# Patient Record
Sex: Female | Born: 1945 | Race: White | Hispanic: No | Marital: Married | Smoking: Never smoker
Health system: Southern US, Community
[De-identification: ages and names within clinical notes are randomized; demographics above are authoritative.]

## PROBLEM LIST (undated history)

## (undated) DIAGNOSIS — R7303 Prediabetes: Secondary | ICD-10-CM

## (undated) DIAGNOSIS — M1990 Unspecified osteoarthritis, unspecified site: Secondary | ICD-10-CM

## (undated) DIAGNOSIS — E039 Hypothyroidism, unspecified: Secondary | ICD-10-CM

## (undated) DIAGNOSIS — F32A Depression, unspecified: Secondary | ICD-10-CM

## (undated) DIAGNOSIS — C801 Malignant (primary) neoplasm, unspecified: Secondary | ICD-10-CM

## (undated) DIAGNOSIS — I10 Essential (primary) hypertension: Secondary | ICD-10-CM

## (undated) DIAGNOSIS — D649 Anemia, unspecified: Secondary | ICD-10-CM

## (undated) DIAGNOSIS — I499 Cardiac arrhythmia, unspecified: Secondary | ICD-10-CM

## (undated) HISTORY — PX: GASTRIC RESTRICTION SURGERY: SHX653

## (undated) HISTORY — DX: Unspecified osteoarthritis, unspecified site: M19.90

## (undated) HISTORY — PX: CHOLECYSTECTOMY: SHX55

## (undated) HISTORY — PX: HERNIA REPAIR: SHX51

## (undated) HISTORY — PX: BREAST SURGERY: SHX581

## (undated) HISTORY — PX: JOINT REPLACEMENT: SHX530

## (undated) HISTORY — PX: TONSILLECTOMY: SUR1361

---

## 2001-04-10 ENCOUNTER — Ambulatory Visit: Admission: RE | Admit: 2001-04-10 | Discharge: 2001-04-10 | Payer: Self-pay | Admitting: Pulmonary Disease

## 2002-12-14 ENCOUNTER — Ambulatory Visit (HOSPITAL_BASED_OUTPATIENT_CLINIC_OR_DEPARTMENT_OTHER): Admission: RE | Admit: 2002-12-14 | Discharge: 2002-12-14 | Payer: Self-pay | Admitting: Orthopedic Surgery

## 2004-08-30 ENCOUNTER — Ambulatory Visit: Payer: Self-pay | Admitting: Family Medicine

## 2004-11-01 ENCOUNTER — Ambulatory Visit: Payer: Self-pay | Admitting: Family Medicine

## 2004-12-31 ENCOUNTER — Ambulatory Visit: Payer: Self-pay | Admitting: Family Medicine

## 2005-01-25 ENCOUNTER — Ambulatory Visit: Payer: Self-pay | Admitting: Family Medicine

## 2005-05-09 ENCOUNTER — Ambulatory Visit: Payer: Self-pay | Admitting: Family Medicine

## 2005-05-16 ENCOUNTER — Ambulatory Visit: Payer: Self-pay | Admitting: Family Medicine

## 2005-06-18 ENCOUNTER — Ambulatory Visit: Payer: Self-pay | Admitting: Internal Medicine

## 2005-07-17 ENCOUNTER — Ambulatory Visit: Payer: Self-pay | Admitting: Internal Medicine

## 2005-07-31 ENCOUNTER — Ambulatory Visit: Payer: Self-pay | Admitting: *Deleted

## 2005-08-02 ENCOUNTER — Ambulatory Visit: Payer: Self-pay | Admitting: Family Medicine

## 2005-08-06 ENCOUNTER — Ambulatory Visit: Payer: Self-pay | Admitting: Internal Medicine

## 2005-08-06 ENCOUNTER — Ambulatory Visit: Payer: Self-pay | Admitting: Cardiology

## 2005-08-15 ENCOUNTER — Ambulatory Visit: Payer: Self-pay | Admitting: Family Medicine

## 2005-08-16 DIAGNOSIS — Z853 Personal history of malignant neoplasm of breast: Secondary | ICD-10-CM | POA: Insufficient documentation

## 2005-08-30 ENCOUNTER — Ambulatory Visit: Payer: Self-pay | Admitting: Internal Medicine

## 2005-09-05 ENCOUNTER — Ambulatory Visit: Payer: Self-pay | Admitting: Oncology

## 2005-09-06 ENCOUNTER — Ambulatory Visit: Admission: RE | Admit: 2005-09-06 | Discharge: 2005-09-30 | Payer: Self-pay | Admitting: Radiation Oncology

## 2005-09-25 ENCOUNTER — Ambulatory Visit: Payer: Self-pay | Admitting: Family Medicine

## 2005-10-07 ENCOUNTER — Ambulatory Visit: Payer: Self-pay | Admitting: Family Medicine

## 2005-10-08 ENCOUNTER — Ambulatory Visit: Admission: RE | Admit: 2005-10-08 | Discharge: 2005-11-08 | Payer: Self-pay | Admitting: Radiation Oncology

## 2005-10-23 ENCOUNTER — Ambulatory Visit: Payer: Self-pay | Admitting: Oncology

## 2005-12-11 ENCOUNTER — Ambulatory Visit: Payer: Self-pay | Admitting: Oncology

## 2006-01-29 ENCOUNTER — Ambulatory Visit: Payer: Self-pay | Admitting: Oncology

## 2006-02-14 ENCOUNTER — Ambulatory Visit: Payer: Self-pay | Admitting: Oncology

## 2006-03-21 ENCOUNTER — Ambulatory Visit: Payer: Self-pay | Admitting: Oncology

## 2006-05-02 ENCOUNTER — Ambulatory Visit: Payer: Self-pay | Admitting: Oncology

## 2006-05-06 ENCOUNTER — Ambulatory Visit: Admission: RE | Admit: 2006-05-06 | Discharge: 2006-07-11 | Payer: Self-pay | Admitting: Radiation Oncology

## 2006-05-14 ENCOUNTER — Ambulatory Visit: Payer: Self-pay | Admitting: Oncology

## 2006-07-04 ENCOUNTER — Ambulatory Visit: Payer: Self-pay | Admitting: Oncology

## 2006-11-07 ENCOUNTER — Ambulatory Visit: Payer: Self-pay | Admitting: Oncology

## 2006-12-29 ENCOUNTER — Ambulatory Visit: Payer: Self-pay | Admitting: Oncology

## 2007-04-20 ENCOUNTER — Ambulatory Visit: Payer: Self-pay | Admitting: Oncology

## 2010-01-08 ENCOUNTER — Inpatient Hospital Stay (HOSPITAL_COMMUNITY): Admission: RE | Admit: 2010-01-08 | Discharge: 2010-01-12 | Payer: Self-pay | Admitting: Orthopedic Surgery

## 2010-11-12 LAB — CBC
HCT: 24.8 % — ABNORMAL LOW (ref 36.0–46.0)
HCT: 26.6 % — ABNORMAL LOW (ref 36.0–46.0)
HCT: 28.9 % — ABNORMAL LOW (ref 36.0–46.0)
Hemoglobin: 8.4 g/dL — ABNORMAL LOW (ref 12.0–15.0)
Hemoglobin: 8.8 g/dL — ABNORMAL LOW (ref 12.0–15.0)
MCV: 96.3 fL (ref 78.0–100.0)
MCV: 96.7 fL (ref 78.0–100.0)
Platelets: 190 10*3/uL (ref 150–400)
RBC: 2.74 MIL/uL — ABNORMAL LOW (ref 3.87–5.11)
RBC: 2.99 MIL/uL — ABNORMAL LOW (ref 3.87–5.11)
WBC: 5.5 10*3/uL (ref 4.0–10.5)
WBC: 7.8 10*3/uL (ref 4.0–10.5)

## 2010-11-12 LAB — GLUCOSE, CAPILLARY
Glucose-Capillary: 109 mg/dL — ABNORMAL HIGH (ref 70–99)
Glucose-Capillary: 221 mg/dL — ABNORMAL HIGH (ref 70–99)
Glucose-Capillary: 96 mg/dL (ref 70–99)

## 2010-11-12 LAB — BASIC METABOLIC PANEL
BUN: 11 mg/dL (ref 6–23)
CO2: 28 mEq/L (ref 19–32)
CO2: 29 mEq/L (ref 19–32)
Calcium: 8.1 mg/dL — ABNORMAL LOW (ref 8.4–10.5)
Calcium: 8.5 mg/dL (ref 8.4–10.5)
Chloride: 106 mEq/L (ref 96–112)
Creatinine, Ser: 0.48 mg/dL (ref 0.4–1.2)
GFR calc Af Amer: >60 mL/min (ref >60)
GFR calc non Af Amer: >60 mL/min (ref >60)
GFR calc non Af Amer: >60 mL/min (ref >60)
Glucose, Bld: 134 mg/dL — ABNORMAL HIGH (ref 70–99)
Potassium: 4 mEq/L (ref 3.5–5.1)
Potassium: 4.2 mEq/L (ref 3.5–5.1)
Sodium: 136 mEq/L (ref 135–145)
Sodium: 138 mEq/L (ref 135–145)

## 2010-11-12 LAB — PROTIME-INR
INR: 1.6 — ABNORMAL HIGH (ref 0.00–1.49)
INR: 1.61 — ABNORMAL HIGH (ref 0.00–1.49)
Prothrombin Time: 13.4 seconds (ref 11.6–15.2)
Prothrombin Time: 19 seconds — ABNORMAL HIGH (ref 11.6–15.2)

## 2010-11-13 LAB — COMPREHENSIVE METABOLIC PANEL
ALT: 35 U/L (ref 0–35)
AST: 40 U/L — ABNORMAL HIGH (ref 0–37)
CO2: 31 mEq/L (ref 19–32)
Calcium: 8.7 mg/dL (ref 8.4–10.5)
Chloride: 105 mEq/L (ref 96–112)
GFR calc Af Amer: >60 mL/min (ref >60)
GFR calc non Af Amer: >60 mL/min (ref >60)
Potassium: 3.3 mEq/L — ABNORMAL LOW (ref 3.5–5.1)
Sodium: 141 mEq/L (ref 135–145)

## 2010-11-13 LAB — URINALYSIS, ROUTINE W REFLEX MICROSCOPIC
Nitrite: NEGATIVE
Specific Gravity, Urine: 1.021 (ref 1.005–1.030)
Urobilinogen, UA: 1 mg/dL (ref 0.0–1.0)

## 2010-11-13 LAB — TYPE AND SCREEN: ABO/RH(D): A POS

## 2010-11-13 LAB — ABO/RH: ABO/RH(D): A POS

## 2010-11-13 LAB — CBC
MCHC: 33 g/dL (ref 30.0–36.0)
RBC: 3.67 MIL/uL — ABNORMAL LOW (ref 3.87–5.11)
WBC: 4.2 10*3/uL (ref 4.0–10.5)

## 2012-04-29 ENCOUNTER — Other Ambulatory Visit: Payer: Self-pay

## 2012-04-29 DIAGNOSIS — M79609 Pain in unspecified limb: Secondary | ICD-10-CM

## 2012-04-29 DIAGNOSIS — R609 Edema, unspecified: Secondary | ICD-10-CM

## 2012-05-08 ENCOUNTER — Encounter: Payer: Self-pay | Admitting: Vascular Surgery

## 2012-05-11 ENCOUNTER — Encounter: Payer: Self-pay | Admitting: Vascular Surgery

## 2012-05-11 ENCOUNTER — Ambulatory Visit (INDEPENDENT_AMBULATORY_CARE_PROVIDER_SITE_OTHER): Payer: Medicare Other | Admitting: Vascular Surgery

## 2012-05-11 ENCOUNTER — Encounter (INDEPENDENT_AMBULATORY_CARE_PROVIDER_SITE_OTHER): Payer: Medicare Other | Admitting: *Deleted

## 2012-05-11 VITALS — BP 149/81 | HR 62 | Resp 18 | Ht 63.0 in | Wt 165.0 lb

## 2012-05-11 DIAGNOSIS — R609 Edema, unspecified: Secondary | ICD-10-CM

## 2012-05-11 DIAGNOSIS — R600 Localized edema: Secondary | ICD-10-CM

## 2012-05-11 DIAGNOSIS — M79606 Pain in leg, unspecified: Secondary | ICD-10-CM | POA: Insufficient documentation

## 2012-05-11 DIAGNOSIS — M79609 Pain in unspecified limb: Secondary | ICD-10-CM

## 2012-05-11 DIAGNOSIS — I83893 Varicose veins of bilateral lower extremities with other complications: Secondary | ICD-10-CM

## 2012-05-11 NOTE — Progress Notes (Signed)
Subjective:     Patient ID: Deborah Frazier, female   DOB: 21-Apr-1946, 66 y.o.   MRN: [id number]  HPI this 66 year old female was referred by Dr. Dina Rich for evaluation of bilateral lower extremity edema. Patient has a remote history of vein stripping in the left leg 40 years ago. She also has had "phlebitis" in both legs in the past. She has no definite history of DVT. She denies a history of stasis ulcers, bleeding, or severe pain but does have aching discomfort as the day progresses. Recently her swelling in both legs has worsened left worse than right. She is not wear elastic compression stockings because of her arthritis and difficulty putting the stockings on. She does not elevate her legs or regular basis.  Past Medical History  Diagnosis Date  . Arthritis     History  Substance Use Topics  . Smoking status: Never Smoker   . Smokeless tobacco: Never Used  . Alcohol Use: No    Family History  Problem Relation Age of Onset  . Arthritis Mother   . Hypertension Mother   . Cancer Father     CANCER    Allergies  Allergen Reactions  . Remicade (Infliximab)     Current outpatient prescriptions:aspirin 81 MG tablet, Take 81 mg by mouth daily., Disp: , Rfl: ;  docusate sodium (COLACE) 100 MG capsule, Take 100 mg by mouth as needed., Disp: , Rfl: ;  fish oil-omega-3 fatty acids 1000 MG capsule, Take 2 g by mouth daily., Disp: , Rfl: ;  glucosamine-chondroitin 500-400 MG tablet, Take 1 tablet by mouth daily., Disp: , Rfl: ;  leflunomide (ARAVA) 20 MG tablet, Take 20 mg by mouth daily., Disp: , Rfl:  levothyroxine (SYNTHROID, LEVOTHROID) 25 MCG tablet, Take 25 mcg by mouth daily., Disp: , Rfl: ;  Multiple Vitamin (STRESS/BIOTIN PO), Take 10,000 mcg by mouth daily., Disp: , Rfl: ;  Multiple Vitamins-Minerals (CENTRUM SILVER PO), Take by mouth daily., Disp: , Rfl: ;  PARoxetine (PAXIL) 30 MG tablet, Take 30 mg by mouth every morning., Disp: , Rfl:   BP 149/81  Pulse 62  Resp 18  Ht  5\' 3"  (1.6 m)  Wt 165 lb (74.844 kg)  BMI 29.23 kg/m2  Body mass index is 29.23 kg/(m^2).        Review of Systems denies chest pain, dyspnea on exertion, PND, orthopnea, hemoptysis. Does complain of leg discomfort with walking, swelling in ankles, weakness in arms and legs, history of right knee replacement. Other systems negative and complete review of systems with the exception of previous history of breast cancer    Objective:   Physical Exam blood pressure 149/81 heart rate 62 respirations 18   Gen.-alert and oriented x3 in no apparent distress HEENT normal for age Lungs no rhonchi or wheezing Cardiovascular regular rhythm no murmurs carotid pulses 3+ palpable no bruits audible Abdomen soft nontender no palpable masses Musculoskeletal previous right knee replacement. Typical deformities for rheumatoid arthritis in upper and lower extremities noted. Skin clear -no rashes Neurologic normal Lower extremities 3+ femoral and dorsalis pedis pulses palpable bilaterally with 1+ bilateral edema. Diffuse spider veins bilaterally in the lower thighs and proximal medial calf. No hyperpigmentation or active ulceration is noted.  Today I ordered bilateral venous duplex exam which I reviewed and interpreted. The right leg reveals reflux in the deep venous system. It appears that she has had previous ligation of her great saphenous vein and the caliber is small. Left leg has previous great  saphenous stripping and is currently absent. There is reflux in the deep system on the left also.  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

## 2014-04-27 ENCOUNTER — Ambulatory Visit (INDEPENDENT_AMBULATORY_CARE_PROVIDER_SITE_OTHER): Payer: Medicare Other

## 2014-04-27 DIAGNOSIS — R52 Pain, unspecified: Secondary | ICD-10-CM

## 2014-04-27 DIAGNOSIS — M216X9 Other acquired deformities of unspecified foot: Secondary | ICD-10-CM

## 2014-04-27 DIAGNOSIS — Q828 Other specified congenital malformations of skin: Secondary | ICD-10-CM

## 2014-04-27 DIAGNOSIS — M21079 Valgus deformity, not elsewhere classified, unspecified ankle: Secondary | ICD-10-CM

## 2014-04-27 DIAGNOSIS — M2040 Other hammer toe(s) (acquired), unspecified foot: Secondary | ICD-10-CM

## 2014-04-27 DIAGNOSIS — M069 Rheumatoid arthritis, unspecified: Secondary | ICD-10-CM

## 2014-04-27 NOTE — Progress Notes (Signed)
   Subjective:    Patient ID: Deborah Frazier, female    DOB: [birth year], 68 y.o.   MRN: [id number]  HPI I AM HERE TO GET SOME NEW INSERTS AND MY 5TH TOE ON MY LEFT FOOT IS SORE AND TENDER AND IS RED AND IT HAS A CORN ON IT AND LAYS OVER AND THROBS AND TINGLING AND HAS BEEN GOING ON FOR ABOUT 2 TO 3 WEEKS AND I DO AN IV SIMPANI ARIA EVERY 8 WEEKS    Review of Systems  Constitutional: Positive for fatigue.  Cardiovascular: Positive for leg swelling.  Musculoskeletal: Positive for gait problem.       JOINT PAIN  All other systems reviewed and are negative.      Objective:   Physical Exam 68 year old white female well-developed well-nourished oriented x3 presents at this time with a complaint of a painful fifth toe left foot as well as a new orthotics. 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  Arch and objective findings reveal vascular status to be intact DP pulse two over four bilateral PT one over 4 bilateral capillary refill time 3 seconds all digits. Epicritic sensations intact and symmetric bilateral is normal plantar response DTRs not listed. Dermatologically skin color pigment normal hair growth absent distally nails somewhat criptotic otherwise unremarkable there is keratoses medial fifth digit left foot to rigid digital contracture and bony prominence. Than orthopedic biomechanical exam severe rheumatoid changes with lateral deviation of the lesser digits on the left on all digits on the left foot lateral deviation is fourth and fifth on the right with medial displacements second third overlapping the hallux on the right to patient is been wearing orthoses for the past 6 years with good success from there should be worn and broken down and need replacing at this time no signs of infection no open wounds no ulcers noted at this time.       Assessment & Plan:  Assessment this  time is hammertoe deformity with possible fracture based on x-ray findings of the fifth digit proximal phalanx head left foot this time keratotic lesion is debrided treated with lumicain Silvadene and a Band-Aid dressing. Patient is dispensed and tubercle padding to keep the toes separated may be candidate for some hammertoe surgery or ostectomy at some point in the future if systems or if symptoms persist or continue. At this time patient is also candidate for new diabetic or her accommodative insoles with a Plastizote top cover somewhat for diabetic orthotic based on her deformity and skin atrophy and rheumatoid changes would benefit to prevent ulceration complications. Scheduled for casting at her convenience plan recommend orthotic a Plastizote top cover [place]x type base with some dorsally and shell. Patient be followed up with the next month with orthotics ready for fitting and dispensing  Harriet Masson DPM

## 2014-04-27 NOTE — Patient Instructions (Signed)
Corns and Calluses Corns are small areas of thickened skin that usually occur on the top, sides, or tip of a toe. They contain a cone-shaped core with a point that can press on a nerve below. This causes pain. Calluses are areas of thickened skin that usually develop on hands, fingers, palms, soles of the feet, and heels. These are areas that experience frequent friction or pressure. CAUSES  Corns are usually the result of rubbing (friction) or pressure from shoes that are too tight or do not fit properly. Calluses are caused by repeated friction and pressure on the affected areas. SYMPTOMS  A hard growth on the skin.  Pain or tenderness under the skin.  Sometimes, redness and swelling.  Increased discomfort while wearing tight-fitting shoes. DIAGNOSIS  Your caregiver can usually tell what the problem is by doing a physical exam. TREATMENT  Removing the cause of the friction or pressure is usually the only treatment needed. However, sometimes medicines can be used to help soften the hardened, thickened areas. These medicines include salicylic acid plasters and 12% ammonium lactate lotion. These medicines should only be used under the direction of your caregiver. HOME CARE INSTRUCTIONS   Try to remove pressure from the affected area.  You may wear donut-shaped corn pads to protect your skin.  You may use a pumice stone or nonmetallic nail file to gently reduce the thickness of a corn.  Wear properly fitted footwear.  If you have calluses on the hands, wear gloves during activities that cause friction.  If you have diabetes, you should regularly examine your feet. Tell your caregiver if you notice any problems with your feet. SEEK IMMEDIATE MEDICAL CARE IF:   You have increased pain, swelling, redness, or warmth in the affected area.  Your corn or callus starts to drain fluid or bleeds.  You are not getting better, even with treatment. Document Released: 05/18/2004 Document  Revised: 11/04/2011 Document Reviewed: 04/09/2011 [employer] Patient Information 2015 [employer]. This information is not intended to replace advice given to you by your health care provider. Make sure you discuss any questions you have with your health care provider.  

## 2014-06-24 ENCOUNTER — Ambulatory Visit (INDEPENDENT_AMBULATORY_CARE_PROVIDER_SITE_OTHER): Payer: Medicare Other

## 2014-06-24 DIAGNOSIS — M21079 Valgus deformity, not elsewhere classified, unspecified ankle: Secondary | ICD-10-CM

## 2014-06-24 DIAGNOSIS — M2040 Other hammer toe(s) (acquired), unspecified foot: Secondary | ICD-10-CM

## 2014-06-24 DIAGNOSIS — M216X9 Other acquired deformities of unspecified foot: Secondary | ICD-10-CM

## 2014-06-24 DIAGNOSIS — Q828 Other specified congenital malformations of skin: Secondary | ICD-10-CM

## 2014-06-24 DIAGNOSIS — M069 Rheumatoid arthritis, unspecified: Secondary | ICD-10-CM

## 2014-06-24 NOTE — Patient Instructions (Signed)

## 2014-06-24 NOTE — Progress Notes (Signed)
   Subjective:    Patient ID: Deborah Frazier, female    DOB: [birth year], 68 y.o.   MRN: [id number]  HPI  PUO AND GIVEN INSTRUCTION.  PT STATED LT FOOT ARCH HAVE HARD PLACE AND IS BEEN SORE FOR 2 WEEKS. THE FOOT IS MUCH BETTER AND GET AGGRAVATED BY PRESSURE. TRIED TO ANTIBIOTIC CREAM BUT NO HELP.  Review of Systems  All other systems reviewed and are negative.  no new findings or systemic changes noted     Objective:   Physical Exam Neurovascular status is intact pedal pulses are palpable patient does have severe deformity of the foot with prominence of the digits as well as significant plantar prominence of the first metatarsocuneiform base plantarly due to rheumatoid arthropathy and prone 3 changes of the foot patient developed a hemorrhagic keratoses or small blister but 5 mm in diameter under this area does not have any discharge or drainage it was erythematous she treated with topical gel and buttock ointment and a bandage since resolved her condition: Down no discharge drainage no signs of infection at the current time. Remainder remainder of exam unremarkable neurovascular status intact rigid digital contractures history of keratoses .at this time dispense one pair of custom orthotics with Plastizote top cover the fit and contour well to the foot patient will initiate break in period using the orthotics appropriately her old orthotics may be dropped off at some point for refurbishing with use Plastizote top cover the cost of $35 when ready. Maintain orthotic use at all time also continue maintaining appropriate accommodative shoes       Assessment & Plan:  Assessment this time rheumatoid arthropathy with capsulitis history of multiple keratoses and gait abnormality. At this time patient is dispensed new functional orthoses with possible top cover to accommodate deformities assist with gait alleviate pressure and prevent ulceration. The appointment future as needed    Harriet Masson  DPM

## 2015-01-16 ENCOUNTER — Ambulatory Visit (INDEPENDENT_AMBULATORY_CARE_PROVIDER_SITE_OTHER): Payer: Medicare Other | Admitting: Podiatry

## 2015-01-16 ENCOUNTER — Encounter: Payer: Self-pay | Admitting: Podiatry

## 2015-01-16 ENCOUNTER — Ambulatory Visit (INDEPENDENT_AMBULATORY_CARE_PROVIDER_SITE_OTHER): Payer: Medicare Other

## 2015-01-16 VITALS — BP 132/70 | HR 66 | Resp 12

## 2015-01-16 DIAGNOSIS — L03032 Cellulitis of left toe: Secondary | ICD-10-CM | POA: Diagnosis not present

## 2015-01-16 DIAGNOSIS — L02612 Cutaneous abscess of left foot: Secondary | ICD-10-CM | POA: Diagnosis not present

## 2015-01-16 DIAGNOSIS — R52 Pain, unspecified: Secondary | ICD-10-CM | POA: Diagnosis not present

## 2015-01-16 MED ORDER — SULFAMETHOXAZOLE-TRIMETHOPRIM 800-160 MG PO TABS: 1.0000 | ORAL_TABLET | Freq: Two times a day (BID) | ORAL | Status: DC

## 2015-01-16 NOTE — Progress Notes (Signed)
   Subjective:    Patient ID: Deborah Frazier, female    DOB: 1945/09/09, 69 y.o.   MRN: [id number]  HPI ''LT FOOT 5TH TOE IS SWOLLEN AND PAINFUL.'' She describes approximately 3 week history of this problem without any specific treatment. Patient describes previous difficult infections to treat and was told that the antibiotic that ultimately resolved infection was an antibiotic, useD for MRSA infections, however, patient does not recall the name of the antibiotic. Currently patient also having local wound care for skin ulcer on her left lower leg by primary care physician applying a silver coated dressing to the area.   Patient is a known rheumatoid arthritic  Review of Systems  Cardiovascular: Positive for leg swelling.  Skin: Positive for color change.       Objective:   Physical Exam  Patient appears orientated 3  Vascular: DP pulse right 2/4 DP pulse left 1/4 PT pulses 2/4 bilaterally  Neurological: Ankle reflexes equal and reactive bilaterally  Dermatological: Gauze dressing over skin ulcer left medial left leg under treatment by another physician Surgical scar over left first MPJ The fifth left toe is erythematous and edematous with macerated skin medial aspect of the fifth digit left without any active drainage, odor or warmth  Musculoskeletal: Lateral drifting of digits 2 through 5, bilaterally Overlapping second right toe   X-ray examination left foot  Decreased bone densities noted throughout all views Decrease joint spaces in rear foot in all views Windswept lateral drifting of toes 2-5 Surgical resection base of proximal phalanx hallux and medial condyle first MPJ Digits 2-5 have extreme contracture at PIPJ Arthritic deformities noted digits 2-5  No emphysema noted fifth digit   Radiographic impression: Arthritic changes noted in rear foot and digits 2-5 Narrowing of all joint space suggested of arthritic changes Deformity of the proximal  interphalangeal joint fifth digit most consistent with arthritic change     Assessment & Plan:   Assessment: Cellulitis fifth toe left foot  Plan: I  discusIed the results of the examination with patient today and advised her that she had a infection in the fifth left toe and I would prescribe oral antibiotics  Rx Bactrim DS by mouth twice a day 10 days, one refill  Patient advised to return for further evaluation if the swelling in pain and redness do not reduce after 10 days of oral antibiotics

## 2015-01-16 NOTE — Patient Instructions (Addendum)
Wear soft shoes Begin taking Bactrim 1 tablet twice a day 10 days Drink a large glass of water when using this medication  Return for further evaluation if the redness and swelling do not reduce with the oral antibiotics 10 days

## 2015-02-09 ENCOUNTER — Ambulatory Visit (INDEPENDENT_AMBULATORY_CARE_PROVIDER_SITE_OTHER): Payer: Medicare Other | Admitting: Podiatry

## 2015-02-09 DIAGNOSIS — A4902 Methicillin resistant Staphylococcus aureus infection, unspecified site: Secondary | ICD-10-CM | POA: Diagnosis not present

## 2015-02-09 DIAGNOSIS — M069 Rheumatoid arthritis, unspecified: Secondary | ICD-10-CM | POA: Diagnosis not present

## 2015-02-09 DIAGNOSIS — M129 Arthropathy, unspecified: Secondary | ICD-10-CM

## 2015-02-09 DIAGNOSIS — M19079 Primary osteoarthritis, unspecified ankle and foot: Secondary | ICD-10-CM

## 2015-02-09 MED ORDER — MELOXICAM 15 MG PO TABS: 15.0000 mg | ORAL_TABLET | Freq: Every day | ORAL | Status: DC

## 2015-02-09 MED ORDER — SULFAMETHOXAZOLE-TRIMETHOPRIM 800-160 MG PO TABS: 1.0000 | ORAL_TABLET | Freq: Two times a day (BID) | ORAL | Status: DC

## 2015-02-09 NOTE — Progress Notes (Signed)
Subjective:     Patient ID: Deborah Frazier, female   DOB: [birth year], 69 y.o.   MRN: [id number]  HPIThis patient returns to the [place] office with red and swollen and painful fifth toe left foot.  She says the toe had just got better on antibiotics and has now gotten bad again.  She has pain walking and wearing her shoes.  She is frustrated concerning the return of her red toe.  She was treated by Dr. Amalia Hailey with bactrim  But he also found masceration in 4th interspace left foot.     Review of Systems     Objective:   Physical Exam Objective: Review of past medical history, medications, social history and allergies were performed.  Vascular: Dorsalis pedis and posterior tibial pulses were palpable B/L, capillary refill was  WNL B/L, temperature gradient was WNL B/L   Skin:  No signs of symptoms of infection or ulcers on both feet  Nails: appear healthy with no signs of mycosis or infections  Sensory: Semmes Weinstein monifilament WNL   Orthopedic: Orthopedic evaluation demonstrates all joints distal t ankle have full ROM without crepitus, muscle power WNL B/L.  There is red swollen painful fifth toe at palpation of base proximal phalanx fifth toe left foot.  There seems to be mild reaction and even the swelling is not severe.  She has mild redness and swelling extending to top of left foot as far as the fifth metatarsal.     Assessment:     Cellulitis fifth toe left foot  2. RA changes radiographically     Plan:     ROV.  Prescribed Bactrim # 20.  Prescribed Mobic to treat an arthritis flare-up.  I could not determine which was her true cause so I treated both conditions. Radiographic studies reveal base fracture proximal phalanx.

## 2015-03-03 ENCOUNTER — Encounter: Payer: Self-pay | Admitting: Podiatry

## 2015-03-03 ENCOUNTER — Ambulatory Visit (INDEPENDENT_AMBULATORY_CARE_PROVIDER_SITE_OTHER): Payer: Medicare Other | Admitting: Podiatry

## 2015-03-03 ENCOUNTER — Ambulatory Visit (INDEPENDENT_AMBULATORY_CARE_PROVIDER_SITE_OTHER): Payer: Medicare Other

## 2015-03-03 ENCOUNTER — Telehealth: Payer: Self-pay | Admitting: *Deleted

## 2015-03-03 VITALS — BP 139/77 | HR 60 | Temp 97.7°F | Resp 12

## 2015-03-03 DIAGNOSIS — L02612 Cutaneous abscess of left foot: Secondary | ICD-10-CM | POA: Diagnosis not present

## 2015-03-03 DIAGNOSIS — M2041 Other hammer toe(s) (acquired), right foot: Secondary | ICD-10-CM | POA: Diagnosis not present

## 2015-03-03 DIAGNOSIS — M069 Rheumatoid arthritis, unspecified: Secondary | ICD-10-CM

## 2015-03-03 DIAGNOSIS — L03032 Cellulitis of left toe: Secondary | ICD-10-CM

## 2015-03-03 DIAGNOSIS — M2040 Other hammer toe(s) (acquired), unspecified foot: Secondary | ICD-10-CM | POA: Diagnosis not present

## 2015-03-03 MED ORDER — SULFAMETHOXAZOLE-TRIMETHOPRIM 800-160 MG PO TABS: 1.0000 | ORAL_TABLET | Freq: Two times a day (BID) | ORAL | Status: DC

## 2015-03-03 NOTE — Telephone Encounter (Signed)
Dr. Paulla Dolly ordered MRI of left 5th toe without contrast suspect osteomyelitis.

## 2015-03-04 NOTE — Progress Notes (Signed)
Subjective:     Patient ID: Deborah Frazier, female   DOB: 28-Jul-1946, 69 y.o.   MRN: [id number]  HPI patient presents stating my left fifth toe has been swollen since September and I have had several different biotics which seem to give me short-term relief of my symptoms but then the redness seems to return and the swelling. I've never noticed any drainage and I do have rheumatoid arthritis   Review of Systems     Objective:   Physical Exam Neurovascular status found to be intact with muscle strength diminished and range of motion diminished subtalar midtarsal joint with numerous indications of rheumatoid type condition within the metatarsal phalangeal joints. Patient's left fifth toe is inflamed but there is no breakage of skin and it does extend the metatarsophalangeal joint with no proximal spread noted currently.    Assessment:     Difficult to ascertain as to whether or not we may be dealing with a osteomyelitis type condition or an inflammatory condition secondary to rheumatoid arthritis    Plan:     I'm going to send her for an MRI due to this condition and it's nine-month history. I did place her on Bactrim as a precautionary measure and she will continue to take that and then today [place] there is a very good chance she is can require amputation of this fifth toe or other more aggressive treatment plan. Hopefully the MRI will help [place] differentiate between an inflammatory and infective process

## 2015-03-09 ENCOUNTER — Telehealth: Payer: Self-pay | Admitting: *Deleted

## 2015-03-09 NOTE — Telephone Encounter (Addendum)
Dr. Paulla Dolly states MRI 03/08/2015 does show osteomyelitis of the left 5th toe, and would like to refer to Dr. Jacqualyn Posey for consultation for amputation of left 5th toe.  I informed pt, that Dr. Paulla Dolly had reviewed the MRI and would like to refer her to Dr. Jacqualyn Posey.  Pt agreed and was transferred to schedulers.

## 2015-03-09 NOTE — Telephone Encounter (Signed)
You don't have to tell them the result. Have them come in and see me. Thanks.

## 2015-03-15 ENCOUNTER — Ambulatory Visit (INDEPENDENT_AMBULATORY_CARE_PROVIDER_SITE_OTHER): Payer: Medicare Other | Admitting: Podiatry

## 2015-03-15 ENCOUNTER — Telehealth: Payer: Self-pay | Admitting: *Deleted

## 2015-03-15 ENCOUNTER — Encounter: Payer: Self-pay | Admitting: Podiatry

## 2015-03-15 VITALS — BP 135/73 | HR 76 | Resp 16

## 2015-03-15 DIAGNOSIS — M86672 Other chronic osteomyelitis, left ankle and foot: Secondary | ICD-10-CM

## 2015-03-15 DIAGNOSIS — L97521 Non-pressure chronic ulcer of other part of left foot limited to breakdown of skin: Secondary | ICD-10-CM | POA: Diagnosis not present

## 2015-03-15 DIAGNOSIS — M2042 Other hammer toe(s) (acquired), left foot: Secondary | ICD-10-CM | POA: Diagnosis not present

## 2015-03-15 NOTE — Telephone Encounter (Signed)
Dr. Jacqualyn Posey referred pt to [facility] for treatment of left 5th toe osteomyelitis, pt refuses surgery.

## 2015-03-16 ENCOUNTER — Encounter: Payer: Self-pay | Admitting: Podiatry

## 2015-03-17 NOTE — Progress Notes (Signed)
Patient ID: Deborah Frazier, female   DOB: [birth year], 69 y.o.   MRN: [id number]  Subjective: 69 year old female presents the office today for follow-up evaluation after MRI. She states that she's had swelling to her left fifth toe for several months. She did previously have an ulceration to the area apparently however she says is not deep and she denies ever having any possibly from a. She states that her pain is improved. She denies any red streaks. Denies any recent injury or trauma. She has just finished her course of antibiotic since she has been on numerous antibiotic soap last several months. No other complaints at this time. Denies any systemic complaints as fevers, chills, nausea, vomiting. Denies any calf pain, chest pain, shortness of breath.  Objective: AAO 3, NAD DP/PT pulses palpable, CRT less than 3 seconds Protective sensation intact with Derrel Nip Monofilament.  Left fifth digit is slightly edematous and erythematous although is not warmth compared to the other digits and there is no ascending cellulitis. There is no areas of fluctuance or crepitus. There is no open wounds at this time. There is no increase in warmth. There is no tennis palpation upon the digit. No other areas of edema, erythema, increase in warmth to bilateral lower extremities. No open lesions or pre-ulcerative lesions bilaterally. No pain with calf compression, swelling, warmth, erythema.  Assessment: 69 year old female chronic osteomyelitis left fifth toe  Plan: -Previous x-rays and MRI results were discussed with the patient. Is it reveal chronic osteomyelitis the fifth digit. I discussed. Treatment options with the patient including antibiotic, amputation. She does not want any amputation at this time. She's been on numerous orthotics apparently over the last several months. There is continue swelling and redness of the toe. We'll refer to infectious disease to see if further antibodies were necessary  or patient just watch and observe the toe. Discussed that she is a high likelihood of amputation. -Follow-up after infectious disease consultation or sooner if any problems are to arise. Call with questions or concerns in the meantime.  Celesta Gentile, DPM

## 2015-03-23 ENCOUNTER — Ambulatory Visit (INDEPENDENT_AMBULATORY_CARE_PROVIDER_SITE_OTHER): Payer: Medicare Other | Admitting: Internal Medicine

## 2015-03-23 ENCOUNTER — Encounter: Payer: Self-pay | Admitting: Internal Medicine

## 2015-03-23 VITALS — BP 108/67 | HR 75 | Temp 98.7°F | Wt 168.0 lb

## 2015-03-23 DIAGNOSIS — M869 Osteomyelitis, unspecified: Secondary | ICD-10-CM | POA: Diagnosis present

## 2015-03-23 DIAGNOSIS — Z9081 Acquired absence of spleen: Secondary | ICD-10-CM

## 2015-03-23 DIAGNOSIS — Z9884 Bariatric surgery status: Secondary | ICD-10-CM

## 2015-03-23 DIAGNOSIS — M069 Rheumatoid arthritis, unspecified: Secondary | ICD-10-CM | POA: Diagnosis not present

## 2015-03-24 ENCOUNTER — Telehealth: Payer: Self-pay | Admitting: *Deleted

## 2015-03-24 ENCOUNTER — Ambulatory Visit (HOSPITAL_COMMUNITY)
Admission: RE | Admit: 2015-03-24 | Discharge: 2015-03-24 | Disposition: A | Discharge status: Home / Self Care | Payer: Medicare Other | Source: Ambulatory Visit | Attending: Internal Medicine | Admitting: Internal Medicine

## 2015-03-24 ENCOUNTER — Encounter (HOSPITAL_COMMUNITY)
Admission: RE | Admit: 2015-03-24 | Discharge: 2015-03-24 | Disposition: A | Discharge status: Home / Self Care | Payer: Medicare Other | Source: Ambulatory Visit | Attending: Internal Medicine | Admitting: Internal Medicine

## 2015-03-24 ENCOUNTER — Other Ambulatory Visit: Payer: Self-pay | Admitting: Internal Medicine

## 2015-03-24 DIAGNOSIS — M868X7 Other osteomyelitis, ankle and foot: Secondary | ICD-10-CM | POA: Diagnosis not present

## 2015-03-24 DIAGNOSIS — M869 Osteomyelitis, unspecified: Secondary | ICD-10-CM | POA: Diagnosis not present

## 2015-03-24 DIAGNOSIS — Z9081 Acquired absence of spleen: Secondary | ICD-10-CM | POA: Insufficient documentation

## 2015-03-24 DIAGNOSIS — Z9884 Bariatric surgery status: Secondary | ICD-10-CM | POA: Insufficient documentation

## 2015-03-24 DIAGNOSIS — M069 Rheumatoid arthritis, unspecified: Secondary | ICD-10-CM | POA: Insufficient documentation

## 2015-03-24 MED ORDER — CEFTRIAXONE SODIUM IN DEXTROSE 40 MG/ML IV SOLN
2.0000 g | Freq: Once | INTRAVENOUS | Status: AC
  Administered 2015-03-24: 2 g via INTRAVENOUS
  Filled 2015-03-24: qty 50

## 2015-03-24 MED ORDER — HEPARIN SOD (PORK) LOCK FLUSH 100 UNIT/ML IV SOLN
INTRAVENOUS | Status: AC
  Filled 2015-03-24: qty 5

## 2015-03-24 MED ORDER — HEPARIN SOD (PORK) LOCK FLUSH 100 UNIT/ML IV SOLN
INTRAVENOUS | Status: AC
  Administered 2015-03-24: 250 [IU]
  Filled 2015-03-24: qty 5

## 2015-03-24 MED ORDER — VANCOMYCIN HCL IN DEXTROSE 1-5 GM/200ML-% IV SOLN
1000.0000 mg | Freq: Once | INTRAVENOUS | Status: AC
  Administered 2015-03-24: 1000 mg via INTRAVENOUS

## 2015-03-24 MED ORDER — VANCOMYCIN HCL IN DEXTROSE 1-5 GM/200ML-% IV SOLN
INTRAVENOUS | Status: AC
  Administered 2015-03-24: 1000 mg via INTRAVENOUS
  Filled 2015-03-24: qty 200

## 2015-03-24 MED ORDER — LIDOCAINE HCL 1 % IJ SOLN
INTRAMUSCULAR | Status: AC
  Filled 2015-03-24: qty 20

## 2015-03-24 NOTE — Progress Notes (Signed)
RFV: osteomyelitis of left foot Subjective:    Patient ID: Deborah Frazier, female    DOB: 1946-01-03, 69 y.o.   MRN: [id number]  HPI  69yo F with rheumatoid arthritis who has been treated for the last 9 months for "cellulitis" of 5th toe of left foot. She did have trauma to her toe late last year that may have been the precursor. She has been intermittently placed on oral antibiotics that have improved her symptoms up until recently where it still persists to be swollen and erythematous. He had expressed purulent material from her toe last week. She is being followed by dr. Paulino [facility] at [facility] who provided multiple courses of bactrim and local wound care dressing with silver coated dressing to the area. He had mri done which showed evidence of osteomyelitis. He recommended amputation but patient was hesistant at that approach. She was referred to [facility] for other treatment options. I have reviewed her doctors records. And i have also independently reviewed xrays of affected foot. 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  She gets her infusion for RA every 8 wks. Her last infusion was roughly 4 wks ago. Her rheumatologist has deferred infusions if she is getting actively treated for infection  Allergies  Allergen Reactions  . Remicade [Infliximab]    Current Outpatient Prescriptions on File Prior to Visit  Medication Sig Dispense Refill  . aspirin 81 MG tablet Take 81 mg by mouth daily.    Marland Kitchen levothyroxine (SYNTHROID, LEVOTHROID) 25 MCG tablet Take 25 mcg by mouth daily.    . Multiple Vitamins-Minerals (CENTRUM SILVER PO) Take by mouth daily.    Marland Kitchen PARoxetine (PAXIL) 30 MG tablet Take 30 mg by mouth every morning.     No current facility-administered medications on file prior to visit.   Active Ambulatory Problems    Diagnosis Date Noted  . Leg pain 05/11/2012  . Edema leg  05/11/2012   Resolved Ambulatory Problems    Diagnosis Date Noted  . No Resolved Ambulatory Problems   Past Medical History  Diagnosis Date  . Arthritis    History  Substance Use Topics  . Smoking status: Never Smoker   . Smokeless tobacco: Never Used  . Alcohol Use: No  family history includes Arthritis in her mother; Cancer in her father; Hypertension in her mother.  Review of Systems  Constitutional: Negative for fever, chills, diaphoresis, activity change, appetite change, fatigue and unexpected weight change.  HENT: Negative for congestion, sore throat, rhinorrhea, sneezing, trouble swallowing and sinus pressure.  Eyes: Negative for photophobia and visual disturbance.  Respiratory: Negative for cough, chest tightness, shortness of breath, wheezing and stridor.  Cardiovascular: Negative for chest pain, palpitations and leg swelling.  Gastrointestinal: Negative for nausea, vomiting, abdominal pain, diarrhea, constipation, blood in stool, abdominal distention and anal bleeding.  Genitourinary: Negative for dysuria, hematuria, flank pain and difficulty urinating.  Musculoskeletal: Negative for myalgias, back pain, joint swelling, arthralgias and gait problem.  Skin: erythema and swelling to left toe Neurological: Negative for dizziness, tremors, weakness and light-headedness.  Hematological: Negative for adenopathy. Does not bruise/bleed easily.  Psychiatric/Behavioral: Negative for behavioral problems, confusion, sleep disturbance, dysphoric mood, decreased concentration and agitation.        Objective:   Physical Exam BP 108/67 mmHg  Pulse 75  Temp(Src) 98.7 F (37.1 C) (Oral)  Wt 168 lb (76.204 kg)  Constitutional:  oriented  to person, place, and time. appears well-developed and well-nourished. No distress.  HENT: [place]/AT, PERRLA, no scleral icterus Mouth/Throat: Oropharynx is clear and moist. No oropharyngeal exudate.  Cardiovascular: Normal rate, regular rhythm and  normal heart sounds. Exam reveals no gallop and no friction rub.  No murmur heard.  Pulmonary/Chest: Effort normal and breath sounds normal. No respiratory distress.  has no wheezes.  Neck = supple, no nuchal rigidity Abdominal: Soft. Bowel sounds are normal.  exhibits no distension. There is no tenderness.  Lymphadenopathy: no cervical adenopathy. No axillary adenopathy eXt: marked deformity to hands and feet from RA Skin: left foot 5th toe swollen erythematous but no drainage or ulcer Psychiatric: a normal mood and affect.  behavior is normal.       Assessment & Plan:  Osteomyelitis of 5th toe = will get cbc with diff, sed rate, crp, bmp. Have disccused options that amputation is likely the fatest modality to treat her bone infection. She would like to avoid surgery, thus we will plan for 6 wk of iv vancomycin plus ceftriaxone. Will need to arrange with radiology for line placement and infusion center for first dose of medication. Will also need to arrange with home healht weekly lab draws to ensure she is therapueutic on medications. Discussed sided effects of antibiotics and precautions. Also discussed that IV therapy may not be sufficient and may eventually go onto amputation  toxity associated with medication = will have home health monitor per protocol vanco trough and send results to clinic as fyi.  Rheumatoid arthritis = anticipate slight delay with next infusion since we are planning on treating with 4-6 wk of IV therapy.  Spent 45 min with greater than 50% in direct counseling and coordination of care regarding foot osteomyelitis.

## 2015-03-24 NOTE — Telephone Encounter (Signed)
Called the patient to advise her that because she did not get labs yesterday she will need to arrive at the hospital early for a STAT (CMP) but was unable to reach her. Tried to get her last labs from [facility] and they would not send them without speaking with the patient.

## 2015-03-24 NOTE — Telephone Encounter (Signed)
Pt did not have labs drawn at [facility].  Verbal order from Dr. Baxter Flattery to have [facility] RN draw CBC w Diff, BMP, Sed Rate and CRP at the start of care visit.  Order given to [place], [place] and read back.

## 2015-04-27 ENCOUNTER — Ambulatory Visit (INDEPENDENT_AMBULATORY_CARE_PROVIDER_SITE_OTHER): Payer: Medicare Other | Admitting: Internal Medicine

## 2015-04-27 ENCOUNTER — Encounter: Payer: Self-pay | Admitting: Internal Medicine

## 2015-04-27 ENCOUNTER — Telehealth: Payer: Self-pay | Admitting: *Deleted

## 2015-04-27 VITALS — BP 121/73 | HR 65 | Temp 98.3°F | Wt 171.0 lb

## 2015-04-27 DIAGNOSIS — M869 Osteomyelitis, unspecified: Secondary | ICD-10-CM

## 2015-04-27 DIAGNOSIS — Z23 Encounter for immunization: Secondary | ICD-10-CM | POA: Diagnosis not present

## 2015-04-27 NOTE — Progress Notes (Signed)
  RFV: left foot osteo Subjective:    Patient ID: Deborah Frazier, female    DOB: [birth year], 69 y.o.   MRN: [id number]  HPI  69yo F with rheumatoid arthritis with injury to left foot treated with oral antibiotics. 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 She has been on 4 wks of IV ceftriaxone and vancomycin  Due for her RA infusion, she has post poned tx. Managed by dr. Amil Amen  Currently on day 33 of 42. Last sed rate 33 down from 40. She has less erythema and pain to the 5th toe since inititation of abtx  Current Outpatient Prescriptions on File Prior to Visit  Medication Sig Dispense Refill  . aspirin 81 MG tablet Take 81 mg by mouth daily.    Marland Kitchen atorvastatin (LIPITOR) 10 MG tablet Take 10 mg by mouth daily.    Marland Kitchen golimumab (SIMPONI ARIA) 50 MG/4ML SOLN injection Inject into the vein every 8 (eight) weeks.    Marland Kitchen leflunomide (ARAVA) 20 MG tablet Take 20 mg by mouth daily.    Marland Kitchen levothyroxine (SYNTHROID, LEVOTHROID) 25 MCG tablet Take 25 mcg by mouth daily.    . Multiple Vitamins-Minerals (CENTRUM SILVER PO) Take by mouth daily.    Marland Kitchen PARoxetine (PAXIL) 30 MG tablet Take 30 mg by mouth every morning.     No current facility-administered medications on file prior to visit.   Active Ambulatory Problems    Diagnosis Date Noted  . Leg pain 05/11/2012  . Edema leg 05/11/2012  . Rheumatoid arthritis 03/24/2015  . Osteomyelitis of foot 03/24/2015  . 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 No Resolved Ambulatory Problems   Past Medical History  Diagnosis Date  . Arthritis      Review of Systems +arthritis, 10 point ros is negative    Objective:   Physical Exam BP 121/73 mmHg  Pulse 65  Temp(Src) 98.3 F (36.8  C) (Oral)  Wt 171 lb (77.565 kg) gen = elderly female in NAD Ext= marked deformity from RA. Left 5th toe, still evidence of erythema but less than previous visit. No open ulcers      Assessment & Plan:  Plan on 42 days of therapy. Continue vancomycin and ceftraixone until sept 9th, can pull picc at that time. Sed rate collected at that time. And detemrine if need to do oral abtx  Will need to discuss with dr. Amil Amen its impact on management of ra  Will give flu shot today

## 2015-04-27 NOTE — Telephone Encounter (Signed)
Called [facility] to give a stop date on IV antibiotics. Per Dr Baxter Flattery the patient will be done 05/05/15 and they can pull the PICC as well as draw a Sed Rate at last lab draw. Patient is aware and lab gave verbal understanding of the orders.

## 2015-06-01 ENCOUNTER — Encounter: Payer: Self-pay | Admitting: Internal Medicine

## 2015-06-01 ENCOUNTER — Ambulatory Visit (INDEPENDENT_AMBULATORY_CARE_PROVIDER_SITE_OTHER): Payer: Medicare Other | Admitting: Internal Medicine

## 2015-06-01 VITALS — BP 148/75 | HR 66 | Temp 97.8°F | Wt 169.0 lb

## 2015-06-01 DIAGNOSIS — M069 Rheumatoid arthritis, unspecified: Secondary | ICD-10-CM | POA: Diagnosis not present

## 2015-06-01 DIAGNOSIS — M869 Osteomyelitis, unspecified: Secondary | ICD-10-CM | POA: Diagnosis present

## 2015-06-04 NOTE — Progress Notes (Signed)
  Rfv: toe osteomyelitis Subjective:    Patient ID: Deborah Frazier, female    DOB: 08/03/1946, 69 y.o.   MRN: [id number]  HPI  69yo F with rheumatoid arthritis with injury to left foot complicated by mri which showed evidence of osteomyelitis.She ha finished 6 wk of Iv therapy with vancomycin and ceftriaxonefor chronic osteomyelitis of proximal phalynx of hte fifth toe. 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 Due to infection, her infusion for RA treatment has been post poned tx. Managed by dr. Amil Amen. She has not been off of antibiotics for 4 wk and has not had further difficulty with 5th toe of left foot. No longer has marked erythema. No swelling. She does get occasional abrasion on 4th digit from her shoes. No furhter injury to feet.  Current Outpatient Prescriptions on File Prior to Visit  Medication Sig Dispense Refill  . aspirin 81 MG tablet Take 81 mg by mouth daily.    Marland Kitchen atorvastatin (LIPITOR) 10 MG tablet Take 10 mg by mouth daily.    Marland Kitchen golimumab (SIMPONI ARIA) 50 MG/4ML SOLN injection Inject into the vein every 8 (eight) weeks.    Marland Kitchen leflunomide (ARAVA) 20 MG tablet Take 20 mg by mouth daily.    Marland Kitchen levothyroxine (SYNTHROID, LEVOTHROID) 25 MCG tablet Take 25 mcg by mouth daily.    . Multiple Vitamins-Minerals (CENTRUM SILVER PO) Take by mouth daily.    Marland Kitchen PARoxetine (PAXIL) 30 MG tablet Take 30 mg by mouth every morning.     No current facility-administered medications on file prior to visit.   Active Ambulatory Problems    Diagnosis Date Noted  . Leg pain 05/11/2012  . Edema leg 05/11/2012  . Rheumatoid arthritis ([place]) 03/24/2015  . Osteomyelitis of foot ([place]) 03/24/2015  . 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 No Resolved Ambulatory Problems   Past Medical History  Diagnosis Date  . Arthritis    Social History  Substance Use Topics  . Smoking status: Never  Smoker   . Smokeless tobacco: Never Used  . Alcohol Use: No    Review of Systems 10 point ros reviewed, does subscribe to arthritis but negative on all other systems    Objective:   Physical Exam BP 148/75 mmHg  Pulse 66  Temp(Src) 97.8 F (36.6 C) (Oral)  Wt 169 lb (76.658 kg) gen = z x o by 4 in NAD Skin = less erythema 5th toe Ext = marked deformaty from RA     Assessment & Plan:  Chronic osteomyelitis of toe = appears clinically stable. At this time, does not warrant further antibiotics  Rheumatoid arthritis = defer to dr. Amil Amen when to reinitiate immune modulators/biologics for management.

## 2015-06-13 ENCOUNTER — Ambulatory Visit: Payer: Medicare Other | Admitting: Internal Medicine

## 2015-06-16 ENCOUNTER — Encounter: Payer: Self-pay | Admitting: Podiatry

## 2016-10-05 IMAGING — XA IR FLUORO GUIDE CV LINE*R*
1 series · 2 of 2 positions shown · IV contrast (agent unspecified)
Comparison: none

INDICATION: Need for central venous access for long term antibiotics for
osteomyelitis

EXAM:
ULTRASOUND AND FLUOROSCOPIC GUIDED PICC LINE INSERTION
MEDICATIONS:
1% Lidocaine
CONTRAST:  None
FLUOROSCOPY TIME:  60 seconds.
COMPLICATIONS:
None immediate
TECHNIQUE: The procedure, risks, benefits, and alternatives were explained to
the patient and informed written consent was obtained. A timeout was
performed prior to the initiation of the procedure.

[Series 1: run · 2 of 2 slices shown]
[im 1/2]
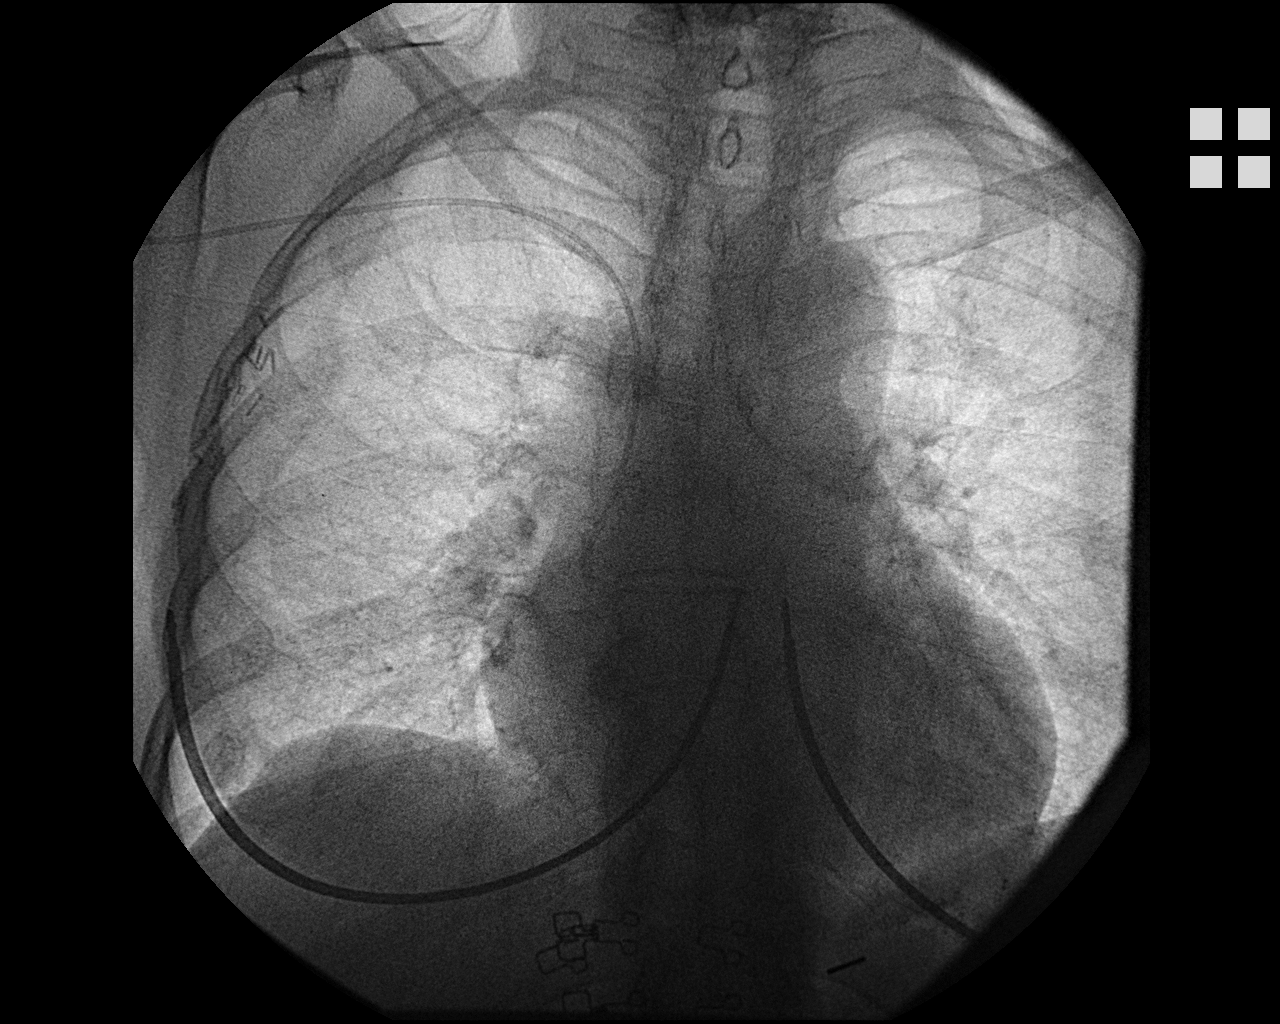
[im 2/2]
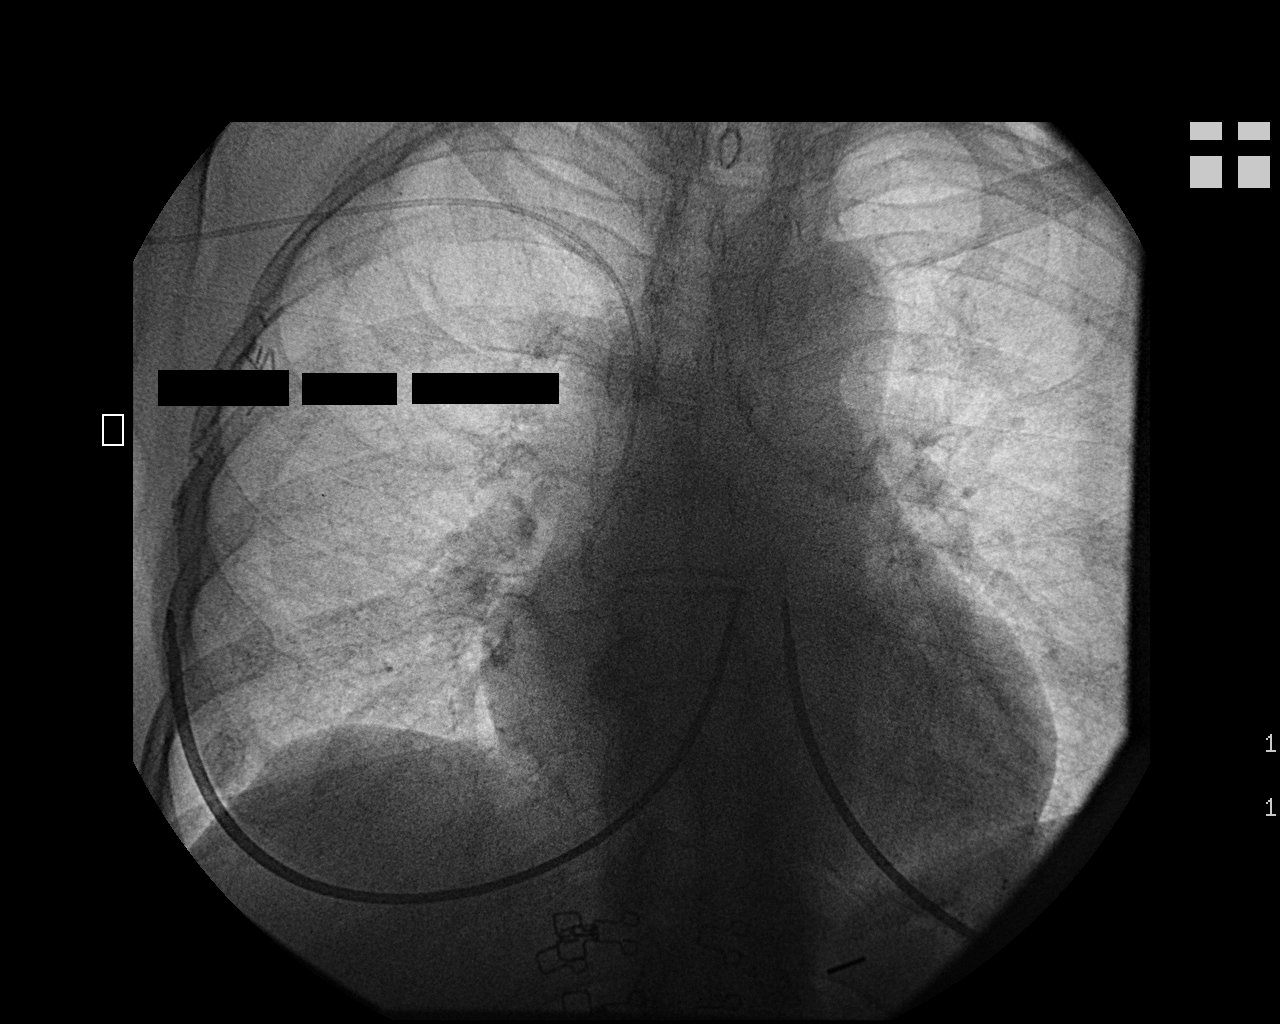

[2 of 2 positions shown; findings below may reference images not displayed]

The right upper extremity was prepped with chlorhexidine in a
sterile fashion, and a sterile drape was applied covering the
operative field. Maximum barrier sterile technique with sterile
gowns and gloves were used for the procedure. A timeout was
performed prior to the initiation of the procedure. Local anesthesia
was provided with 1% lidocaine.

Under direct ultrasound guidance, the right brachial vein was
accessed with a micropuncture kit after the overlying soft tissues
were anesthetized with 1% lidocaine. An ultrasound image was saved
for documentation purposes. A guidewire was advanced to the level of
the superior caval-atrial junction for measurement purposes and the
PICC line was cut to length. A peel-away sheath was placed and a 33
cm, 5 French, single lumen was inserted to level of the superior
caval-atrial junction. A post procedure spot fluoroscopic was
obtained. The catheter easily aspirated and flushed and was sutured
in place. A dressing was placed. The patient tolerated the procedure
well without immediate post procedural complication.
FINDINGS: After catheter placement, the tip lies within the superior
cavoatrial junction. The catheter aspirates and flushes normally and
is ready for immediate use.
IMPRESSION: Successful ultrasound and fluoroscopic guided placement of a right
brachial vein approach, 33 cm, 5 French, dual lumen PICC with tip at
the superior caval-atrial junction. The PICC line is ready for
immediate use.

## 2017-03-07 DIAGNOSIS — D649 Anemia, unspecified: Secondary | ICD-10-CM | POA: Diagnosis not present

## 2017-03-07 DIAGNOSIS — M069 Rheumatoid arthritis, unspecified: Secondary | ICD-10-CM | POA: Diagnosis not present

## 2017-03-07 DIAGNOSIS — M8580 Other specified disorders of bone density and structure, unspecified site: Secondary | ICD-10-CM | POA: Diagnosis not present

## 2017-03-07 DIAGNOSIS — Z853 Personal history of malignant neoplasm of breast: Secondary | ICD-10-CM | POA: Diagnosis not present

## 2017-10-22 ENCOUNTER — Ambulatory Visit (INDEPENDENT_AMBULATORY_CARE_PROVIDER_SITE_OTHER): Payer: Medicare Other | Admitting: Sports Medicine

## 2017-10-22 ENCOUNTER — Encounter: Payer: Self-pay | Admitting: Sports Medicine

## 2017-10-22 VITALS — BP 119/67 | HR 58

## 2017-10-22 DIAGNOSIS — M2042 Other hammer toe(s) (acquired), left foot: Secondary | ICD-10-CM

## 2017-10-22 DIAGNOSIS — M05711 Rheumatoid arthritis with rheumatoid factor of right shoulder without organ or systems involvement: Secondary | ICD-10-CM

## 2017-10-22 DIAGNOSIS — L84 Corns and callosities: Secondary | ICD-10-CM | POA: Diagnosis not present

## 2017-10-22 DIAGNOSIS — M79672 Pain in left foot: Secondary | ICD-10-CM

## 2017-10-22 NOTE — Progress Notes (Signed)
Subjective: Deborah Frazier is a 72 y.o. female patient who presents to office for evaluation of Left foot pain secondary to callus skin. Patient complains of pain at the lesion present the fourth hammertoe.  Patient states that she wonders if getting diabetic shoes even though she is not diabetic with help.  Patient reports that she got shoes from [place] previously and had to pay out of pocket for them and wants to discuss if there are any other options for treatment to prevent rubbing and irritation at left fourth toe. Patient is currently being treated with IV injection every 8 weeks for her rheumatoid arthritis. Patient denies any other pedal complaints.   Patient Active Problem List   Diagnosis Date Noted  . Rheumatoid arthritis ([place]) 03/24/2015  . Osteomyelitis of foot ([place]) 03/24/2015  . 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 Leg pain 05/11/2012  . Edema leg 05/11/2012    Current Outpatient Medications on File Prior to Visit  Medication Sig Dispense Refill  . apixaban (ELIQUIS) 5 MG TABS tablet Take by mouth.    Marland Kitchen atorvastatin (LIPITOR) 40 MG tablet Take by mouth.    . calcium carbonate (CALCIUM 600) 600 MG TABS tablet Take by mouth.    . docusate sodium (COLACE) 100 MG capsule Take by mouth.    . furosemide (LASIX) 20 MG tablet Take by mouth.    Marland Kitchen golimumab (SIMPONI ARIA) 50 MG/4ML SOLN injection Inject into the vein every 8 (eight) weeks.    Marland Kitchen leflunomide (ARAVA) 20 MG tablet Take 20 mg by mouth daily.    Marland Kitchen levothyroxine (SYNTHROID, LEVOTHROID) 25 MCG tablet Take 25 mcg by mouth daily.    . metoprolol succinate (TOPROL-XL) 50 MG 24 hr tablet Take by mouth.    . Multiple Vitamins-Minerals (CENTRUM SILVER PO) Take by mouth daily.    Marland Kitchen PARoxetine (PAXIL) 30 MG tablet Take 30 mg by mouth every morning.    Marland Kitchen aspirin 81 MG tablet Take 81 mg by mouth daily.    Marland Kitchen atorvastatin (LIPITOR) 10 MG tablet Take 10 mg by mouth daily.     No current  facility-administered medications on file prior to visit.     Allergies  Allergen Reactions  . Remicade [Infliximab]     Objective:  General: Alert and oriented x3 in no acute distress  Dermatology: Very minimal keratotic lesion present fourth toe dorsal aspect with skin lines transversing the lesion, minimal pain is present with direct pressure to the lesion with a central nucleated core noted, focal erythema due to irritation at left fourth toe with no other signs of infection, no webspace macerations, no ecchymosis bilateral, all nails x 10 are well manicured.  Vascular: Dorsalis Pedis and Posterior Tibial pedal pulses 1/4, Capillary Fill Time 5 seconds, scant pedal hair growth bilateral, no edema bilateral lower extremities, Temperature gradient within normal limits.  Neurology: Johney Maine sensation intact via light touch bilateral.  Musculoskeletal: Mild tenderness with palpation at the keratotic lesion site on left fourth toe, there is significant digital deformity and crossover deformity secondary to rheumatoid arthritis.  Assessment and Plan: Problem List Items Addressed This Visit      Musculoskeletal and Integument   Rheumatoid arthritis ([place])    Other Visit Diagnoses    Corn of toe    -  Primary   Hammertoe of left foot       Left foot pain          -Complete examination performed -Discussed  treatment options for corn of hammertoe on left foot -Keratotic lesion was very minimal thus not requiring trimming at today's visit -Discussed with patient that diabetic shoes will not be covered since patient is not diabetic; patient will have to pay out of pocket for any shoes acquired through office patient states that this can be costly so she would like to wait on this I encourage patient to go to take her shoes for stretching and to also remove the inside liner to allow more space in her shoes around her toes -Dispensed toe cap to use when in shoes to prevent rubbing and  irritation to left fourth toe -Encouraged daily skin emollients -Patient is not a surgical candidate due to age and immune compromised state -Continue with rheumatology follow-up -Patient to return to office as needed or sooner if condition worsens.  Landis Martins, DPM

## 2018-03-12 DIAGNOSIS — M8580 Other specified disorders of bone density and structure, unspecified site: Secondary | ICD-10-CM

## 2018-03-12 DIAGNOSIS — M069 Rheumatoid arthritis, unspecified: Secondary | ICD-10-CM | POA: Diagnosis not present

## 2018-03-12 DIAGNOSIS — Z853 Personal history of malignant neoplasm of breast: Secondary | ICD-10-CM | POA: Diagnosis not present

## 2018-03-12 DIAGNOSIS — D649 Anemia, unspecified: Secondary | ICD-10-CM | POA: Diagnosis not present

## 2018-03-12 DIAGNOSIS — Z79899 Other long term (current) drug therapy: Secondary | ICD-10-CM | POA: Diagnosis not present

## 2018-07-01 DIAGNOSIS — M069 Rheumatoid arthritis, unspecified: Secondary | ICD-10-CM | POA: Diagnosis not present

## 2018-07-01 DIAGNOSIS — K56609 Unspecified intestinal obstruction, unspecified as to partial versus complete obstruction: Secondary | ICD-10-CM | POA: Diagnosis not present

## 2018-07-01 DIAGNOSIS — I10 Essential (primary) hypertension: Secondary | ICD-10-CM | POA: Diagnosis not present

## 2018-07-02 DIAGNOSIS — K56609 Unspecified intestinal obstruction, unspecified as to partial versus complete obstruction: Secondary | ICD-10-CM | POA: Diagnosis not present

## 2018-07-02 DIAGNOSIS — I10 Essential (primary) hypertension: Secondary | ICD-10-CM | POA: Diagnosis not present

## 2018-07-02 DIAGNOSIS — L0390 Cellulitis, unspecified: Secondary | ICD-10-CM | POA: Diagnosis not present

## 2018-07-02 DIAGNOSIS — M069 Rheumatoid arthritis, unspecified: Secondary | ICD-10-CM | POA: Diagnosis not present

## 2018-07-03 DIAGNOSIS — M069 Rheumatoid arthritis, unspecified: Secondary | ICD-10-CM | POA: Diagnosis not present

## 2018-07-03 DIAGNOSIS — L0390 Cellulitis, unspecified: Secondary | ICD-10-CM | POA: Diagnosis not present

## 2018-07-03 DIAGNOSIS — I10 Essential (primary) hypertension: Secondary | ICD-10-CM | POA: Diagnosis not present

## 2018-07-03 DIAGNOSIS — K56609 Unspecified intestinal obstruction, unspecified as to partial versus complete obstruction: Secondary | ICD-10-CM | POA: Diagnosis not present

## 2020-05-08 DIAGNOSIS — C50419 Malignant neoplasm of upper-outer quadrant of unspecified female breast: Secondary | ICD-10-CM | POA: Diagnosis not present

## 2020-05-08 DIAGNOSIS — M069 Rheumatoid arthritis, unspecified: Secondary | ICD-10-CM | POA: Diagnosis not present

## 2020-05-08 DIAGNOSIS — D638 Anemia in other chronic diseases classified elsewhere: Secondary | ICD-10-CM | POA: Diagnosis not present

## 2020-05-08 DIAGNOSIS — M899 Disorder of bone, unspecified: Secondary | ICD-10-CM | POA: Diagnosis not present

## 2020-07-16 DIAGNOSIS — I361 Nonrheumatic tricuspid (valve) insufficiency: Secondary | ICD-10-CM | POA: Diagnosis not present

## 2020-12-15 NOTE — Patient Instructions (Addendum)
DUE TO COVID-19 ONLY ONE VISITOR IS ALLOWED TO COME WITH YOU AND STAY IN THE WAITING ROOM ONLY DURING PRE OP AND PROCEDURE DAY OF SURGERY. THE 1 VISITOR  MAY VISIT WITH YOU AFTER SURGERY IN YOUR PRIVATE ROOM DURING VISITING HOURS ONLY!  YOU NEED TO HAVE A COVID 19 TEST ON: 12/18/20 , THIS TEST MUST BE DONE BEFORE SURGERY,  COVID TESTING SITE [address] [place] [address], IT IS ON THE RIGHT GOING OUT [address] APPROXIMATELY  2 MINUTES PAST [employer] ON THE RIGHT. ONCE YOUR COVID TEST IS COMPLETED,  PLEASE BEGIN THE QUARANTINE INSTRUCTIONS AS OUTLINED IN YOUR HANDOUT.                Deborah Frazier    Your procedure is scheduled on: 12/21/20   Report to [facility] Main  Entrance   Report to admitting at: 12:20 PM     Call this number if you have problems the morning of surgery [phone]    Remember:  NO SOLID FOOD AFTER MIDNIGHT THE NIGHT PRIOR TO SURGERY. NOTHING BY MOUTH EXCEPT CLEAR LIQUIDS UNTIL: 11:50 AM . PLEASE FINISH ENSURE DRINK PER SURGEON ORDER  WHICH NEEDS TO BE COMPLETED AT: 11:50 AM .  CLEAR LIQUID DIET  Foods Allowed                                                                     Foods Excluded  Coffee and tea, regular and decaf                             liquids that you cannot  Plain Jell-O any favor except red or purple                                           see through such as: Fruit ices (not with fruit pulp)                                     milk, soups, orange juice  Iced Popsicles                                    All solid food Carbonated beverages, regular and diet                                    Cranberry, grape and apple juices Sports drinks like Gatorade Lightly seasoned clear broth or consume(fat free) Sugar, honey syrup  Sample Menu Breakfast                                Lunch  Supper Cranberry juice                    Beef broth                             Chicken broth Jell-O                                     Grape juice                           Apple juice Coffee or tea                        Jell-O                                      Popsicle                                                Coffee or tea                        Coffee or tea  _____________________________________________________________________  BRUSH YOUR TEETH MORNING OF SURGERY AND RINSE YOUR MOUTH OUT, NO CHEWING GUM CANDY OR MINTS.    Take these medicines the morning of surgery with A SIP OF WATER: levothyroxine,metoprolol,paxil,arava.                               You may not have any metal on your body including hair pins and              piercings  Do not wear jewelry, make-up, lotions, powders or perfumes, deodorant             Do not wear nail polish on your fingernails.  Do not shave  48 hours prior to surgery.    Do not bring valuables to the hospital. [facility] IS NOT             RESPONSIBLE   FOR VALUABLES.  Contacts, dentures or bridgework may not be worn into surgery.  Leave suitcase in the car. After surgery it may be brought to your room.     Patients discharged the day of surgery will not be allowed to drive home. IF YOU ARE HAVING SURGERY AND GOING HOME THE SAME DAY, YOU MUST HAVE AN ADULT TO DRIVE YOU HOME AND BE WITH YOU FOR 24 HOURS. YOU MAY GO HOME BY TAXI OR UBER OR ORTHERWISE, BUT AN ADULT MUST ACCOMPANY YOU HOME AND STAY WITH YOU FOR 24 HOURS.  Name and phone number of your driver:  Special Instructions: N/A              Please read over the following fact sheets you were given: _____________________________________________________________________         [facility] - Pre[facility] for [facility] Before surgery, you can play an important role.  Because skin is not sterile, your skin needs to be as free of germs as possible.  You can reduce  the number of germs on your skin by washing with CHG (chlorahexidine gluconate) soap before surgery.  CHG  is an antiseptic cleaner which kills germs and bonds with the skin to continue killing germs even after washing. Please DO NOT use if you have an allergy to CHG or antibacterial soaps.  If your skin becomes reddened/irritated stop using the CHG and inform your nurse when you arrive at Short Stay. Do not shave (including legs and underarms) for at least 48 hours prior to the first CHG shower.  You may shave your face/neck. Please follow these instructions carefully:  1.  Shower with CHG Soap the night before surgery and the  morning of Surgery.  2.  If you choose to wash your hair, wash your hair first as usual with your  normal  shampoo.  3.  After you shampoo, rinse your hair and body thoroughly to remove the  shampoo.                           4.  Use CHG as you would any other liquid soap.  You can apply chg directly  to the skin and wash                       Gently with a scrungie or clean washcloth.  5.  Apply the CHG Soap to your body ONLY FROM THE NECK DOWN.   Do not use on face/ open                           Wound or open sores. Avoid contact with eyes, ears mouth and genitals (private parts).                       Wash face,  Genitals (private parts) with your normal soap.             6.  Wash thoroughly, paying special attention to the area where your surgery  will be performed.  7.  Thoroughly rinse your body with warm water from the neck down.  8.  DO NOT shower/wash with your normal soap after using and rinsing off  the CHG Soap.                9.  Pat yourself dry with a clean towel.            10.  Wear clean pajamas.            11.  Place clean sheets on your bed the night of your first shower and do not  sleep with pets. Day of Surgery : Do not apply any lotions/deodorants the morning of surgery.  Please wear clean clothes to the hospital/surgery center.  FAILURE TO FOLLOW THESE INSTRUCTIONS MAY RESULT IN THE CANCELLATION OF YOUR SURGERY PATIENT  SIGNATURE_________________________________  NURSE SIGNATURE__________________________________  ________________________________________________________________________  [facility]- Preparing for Total Shoulder Arthroplasty    Before surgery, you can play an important role. Because skin is not sterile, your skin needs to be as free of germs as possible. You can reduce the number of germs on your skin by using the following products. . Benzoyl Peroxide Gel o Reduces the number of germs present on the skin o Applied twice a day to shoulder area starting two days before surgery    ==================================================================  Please follow these instructions carefully:  BENZOYL PEROXIDE 5% GEL  Please do not use if you have an allergy to benzoyl peroxide.   If your skin becomes reddened/irritated stop using the benzoyl peroxide.  Starting two days before surgery, apply as follows: 1. Apply benzoyl peroxide in the morning and at night. Apply after taking a shower. If you are not taking a shower clean entire shoulder front, back, and side along with the armpit with a clean wet washcloth.  2. Place a quarter-sized dollop on your shoulder and rub in thoroughly, making sure to cover the front, back, and side of your shoulder, along with the armpit.   2 days before ____ AM   ____ PM              1 day before ____ AM   ____ PM                         3. Do this twice a day for two days.  (Last application is the night before surgery, AFTER using the CHG soap as described below).  4. Do NOT apply benzoyl peroxide gel on the day of surgery.   Incentive Spirometer  An incentive spirometer is a tool that can help keep your lungs clear and active. This tool measures how well you are filling your lungs with each breath. Taking long deep breaths may help reverse or decrease the chance of developing breathing (pulmonary) problems (especially infection) following:  A long  period of time when you are unable to move or be active. BEFORE THE PROCEDURE   If the spirometer includes an indicator to show your best effort, your nurse or respiratory therapist will set it to a desired goal.  If possible, sit up straight or lean slightly forward. Try not to slouch.  Hold the incentive spirometer in an upright position. INSTRUCTIONS FOR USE  1. Sit on the edge of your bed if possible, or sit up as far as you can in bed or on a chair. 2. Hold the incentive spirometer in an upright position. 3. Breathe out normally. 4. Place the mouthpiece in your mouth and seal your lips tightly around it. 5. Breathe in slowly and as deeply as possible, raising the piston or the ball toward the top of the column. 6. Hold your breath for 3-5 seconds or for as long as possible. Allow the piston or ball to fall to the bottom of the column. 7. Remove the mouthpiece from your mouth and breathe out normally. 8. Rest for a few seconds and repeat Steps 1 through 7 at least 10 times every 1-2 hours when you are awake. Take your time and take a few normal breaths between deep breaths. 9. The spirometer may include an indicator to show your best effort. Use the indicator as a goal to work toward during each repetition. 10. After each set of 10 deep breaths, practice coughing to be sure your lungs are clear. If you have an incision (the cut made at the time of surgery), support your incision when coughing by placing a pillow or rolled up towels firmly against it. Once you are able to get out of bed, walk around indoors and cough well. You may stop using the incentive spirometer when instructed by your caregiver.  RISKS AND COMPLICATIONS  Take your time so you do not get dizzy or light-headed.  If you are in pain, you may need to take or ask for pain medication before doing incentive spirometry. It is harder to take  a deep breath if you are having pain. AFTER USE  Rest and breathe slowly and  easily.  It can be helpful to keep track of a log of your progress. Your caregiver can provide you with a simple table to help with this. If you are using the spirometer at home, follow these instructions: [place] IF:   You are having difficultly using the spirometer.  You have trouble using the spirometer as often as instructed.  Your pain medication is not giving enough relief while using the spirometer.  You develop fever of 100.5 F (38.1 C) or higher. SEEK IMMEDIATE MEDICAL CARE IF:   You cough up bloody sputum that had not been present before.  You develop fever of 102 F (38.9 C) or greater.  You develop worsening pain at or near the incision site. MAKE SURE YOU:   Understand these instructions.  Will watch your condition.  Will get help right away if you are not doing well or get worse. Document Released: 12/23/2006 Document Revised: 11/04/2011 Document Reviewed: 02/23/2007 [facility] Patient Information 2014 [place], [place].   ________________________________________________________________________

## 2020-12-18 ENCOUNTER — Other Ambulatory Visit (HOSPITAL_COMMUNITY)
Admission: RE | Admit: 2020-12-18 | Discharge: 2020-12-18 | Disposition: A | Discharge status: Home / Self Care | Payer: Medicare Other | Source: Ambulatory Visit | Attending: Orthopedic Surgery | Admitting: Orthopedic Surgery

## 2020-12-18 ENCOUNTER — Encounter (HOSPITAL_COMMUNITY)
Admission: RE | Admit: 2020-12-18 | Discharge: 2020-12-18 | Disposition: A | Discharge status: Home / Self Care | Payer: Medicare Other | Source: Ambulatory Visit | Attending: Orthopedic Surgery | Admitting: Orthopedic Surgery

## 2020-12-18 ENCOUNTER — Encounter (HOSPITAL_COMMUNITY): Payer: Self-pay

## 2020-12-18 ENCOUNTER — Other Ambulatory Visit: Payer: Self-pay

## 2020-12-18 DIAGNOSIS — Z20822 Contact with and (suspected) exposure to covid-19: Secondary | ICD-10-CM | POA: Diagnosis not present

## 2020-12-18 DIAGNOSIS — Z01818 Encounter for other preprocedural examination: Secondary | ICD-10-CM | POA: Diagnosis not present

## 2020-12-18 HISTORY — DX: Cardiac arrhythmia, unspecified: I49.9

## 2020-12-18 HISTORY — DX: Anemia, unspecified: D64.9

## 2020-12-18 HISTORY — DX: Hypothyroidism, unspecified: E03.9

## 2020-12-18 HISTORY — DX: Prediabetes: R73.03

## 2020-12-18 HISTORY — DX: Depression, unspecified: F32.A

## 2020-12-18 HISTORY — DX: Essential (primary) hypertension: I10

## 2020-12-18 HISTORY — DX: Malignant (primary) neoplasm, unspecified: C80.1

## 2020-12-18 LAB — CBC
HCT: 37.6 % (ref 36.0–46.0)
Hemoglobin: 12 g/dL (ref 12.0–15.0)
MCH: 32 pg (ref 26.0–34.0)
MCHC: 31.9 g/dL (ref 30.0–36.0)
MCV: 100.3 fL — ABNORMAL HIGH (ref 80.0–100.0)
Platelets: 209 10*3/uL (ref 150–400)
RBC: 3.75 MIL/uL — ABNORMAL LOW (ref 3.87–5.11)
RDW: 13.6 % (ref 11.5–15.5)
WBC: 5.2 10*3/uL (ref 4.0–10.5)
nRBC: 0 % (ref 0.0–0.2)

## 2020-12-18 LAB — BASIC METABOLIC PANEL
Anion gap: 8 (ref 5–15)
BUN: 16 mg/dL (ref 8–23)
CO2: 26 mmol/L (ref 22–32)
Calcium: 9.2 mg/dL (ref 8.9–10.3)
Chloride: 108 mmol/L (ref 98–111)
Creatinine, Ser: 0.62 mg/dL (ref 0.44–1.00)
GFR, Estimated: >60 mL/min (ref >60)
Glucose, Bld: 99 mg/dL (ref 70–99)
Potassium: 5 mmol/L (ref 3.5–5.1)
Sodium: 142 mmol/L (ref 135–145)

## 2020-12-18 NOTE — Progress Notes (Signed)
COVID Vaccine Completed: Yes Date COVID Vaccine completed: 10/27/19 COVID vaccine manufacturer:  Moderna     PCP - Dr. Algis Greenhouse Cardiologist - Dr. Horald Pollen [facility]  Chest x-ray -  EKG -  Stress Test -  ECHO - 08/16/15 [facility] Cardiac Cath -  Pacemaker/ICD device last checked:  Sleep Study -  CPAP -   Fasting Blood Sugar -  Checks Blood Sugar _____ times a day  Blood Thinner Instructions: Eliquis will be held one day before surgery. Aspirin Instructions: Last Dose:  Anesthesia review: Hx: HTN,A fib. Pt. Denied any SOB,able to ambulate with some difficulty due to arthritis.  Patient denies shortness of breath, fever, cough and chest pain at PAT appointment   Patient verbalized understanding of instructions that were given to them at the PAT appointment. Patient was also instructed that they will need to review over the PAT instructions again at home before surgery.

## 2020-12-19 LAB — SARS CORONAVIRUS 2 (TAT 6-24 HRS): SARS Coronavirus 2: NEGATIVE

## 2020-12-21 ENCOUNTER — Ambulatory Visit (HOSPITAL_COMMUNITY): Payer: Medicare Other | Admitting: Physician Assistant

## 2020-12-21 ENCOUNTER — Ambulatory Visit (HOSPITAL_COMMUNITY): Payer: Medicare Other | Admitting: Anesthesiology

## 2020-12-21 ENCOUNTER — Encounter (HOSPITAL_COMMUNITY): Payer: Self-pay | Admitting: Orthopedic Surgery

## 2020-12-21 ENCOUNTER — Other Ambulatory Visit: Payer: Self-pay

## 2020-12-21 ENCOUNTER — Observation Stay (HOSPITAL_COMMUNITY)
Admission: RE | Admit: 2020-12-21 | Discharge: 2020-12-22 | Disposition: A | Discharge status: Home / Self Care | Payer: Medicare Other | Attending: Orthopedic Surgery | Admitting: Orthopedic Surgery

## 2020-12-21 ENCOUNTER — Encounter (HOSPITAL_COMMUNITY)
Admission: RE | Disposition: A | Discharge status: Home / Self Care | Payer: Self-pay | Source: Home / Self Care | Attending: Orthopedic Surgery

## 2020-12-21 DIAGNOSIS — Z85828 Personal history of other malignant neoplasm of skin: Secondary | ICD-10-CM | POA: Diagnosis not present

## 2020-12-21 DIAGNOSIS — Z96659 Presence of unspecified artificial knee joint: Secondary | ICD-10-CM | POA: Diagnosis not present

## 2020-12-21 DIAGNOSIS — Z96612 Presence of left artificial shoulder joint: Secondary | ICD-10-CM

## 2020-12-21 DIAGNOSIS — M06012 Rheumatoid arthritis without rheumatoid factor, left shoulder: Principal | ICD-10-CM | POA: Insufficient documentation

## 2020-12-21 DIAGNOSIS — E039 Hypothyroidism, unspecified: Secondary | ICD-10-CM | POA: Diagnosis not present

## 2020-12-21 DIAGNOSIS — Z7901 Long term (current) use of anticoagulants: Secondary | ICD-10-CM | POA: Insufficient documentation

## 2020-12-21 DIAGNOSIS — M25312 Other instability, left shoulder: Secondary | ICD-10-CM | POA: Insufficient documentation

## 2020-12-21 DIAGNOSIS — R7303 Prediabetes: Secondary | ICD-10-CM | POA: Insufficient documentation

## 2020-12-21 DIAGNOSIS — I10 Essential (primary) hypertension: Secondary | ICD-10-CM | POA: Diagnosis not present

## 2020-12-21 DIAGNOSIS — Z79899 Other long term (current) drug therapy: Secondary | ICD-10-CM | POA: Diagnosis not present

## 2020-12-21 HISTORY — PX: REVERSE SHOULDER ARTHROPLASTY: SHX5054

## 2020-12-21 SURGERY — ARTHROPLASTY, SHOULDER, TOTAL, REVERSE
Anesthesia: Regional | Site: Shoulder | Laterality: Left

## 2020-12-21 MED ORDER — ORAL CARE MOUTH RINSE: 15.0000 mL | Freq: Once | OROMUCOSAL | Status: AC

## 2020-12-21 MED ORDER — LIDOCAINE 2% (20 MG/ML) 5 ML SYRINGE
INTRAMUSCULAR | Status: AC
  Filled 2020-12-21: qty 5

## 2020-12-21 MED ORDER — ACETAMINOPHEN 325 MG PO TABS
325.0000 mg | ORAL_TABLET | Freq: Four times a day (QID) | ORAL | Status: DC | PRN
  Administered 2020-12-22: 650 mg via ORAL
  Filled 2020-12-21: qty 2

## 2020-12-21 MED ORDER — LEVOTHYROXINE SODIUM 25 MCG PO TABS
25.0000 ug | ORAL_TABLET | Freq: Every day | ORAL | Status: DC
  Administered 2020-12-22: 25 ug via ORAL
  Filled 2020-12-21: qty 1

## 2020-12-21 MED ORDER — ACETAMINOPHEN 10 MG/ML IV SOLN
INTRAVENOUS | Status: AC
  Filled 2020-12-21: qty 100

## 2020-12-21 MED ORDER — SODIUM CHLORIDE 0.9 % IR SOLN
Status: DC | PRN
  Administered 2020-12-21: 1000 mL

## 2020-12-21 MED ORDER — METOCLOPRAMIDE HCL 5 MG PO TABS: 5.0000 mg | ORAL_TABLET | Freq: Three times a day (TID) | ORAL | Status: DC | PRN

## 2020-12-21 MED ORDER — LACTATED RINGERS IV SOLN: INTRAVENOUS | Status: DC

## 2020-12-21 MED ORDER — OXYCODONE HCL 5 MG PO TABS: 10.0000 mg | ORAL_TABLET | ORAL | Status: DC | PRN

## 2020-12-21 MED ORDER — METOPROLOL SUCCINATE ER 25 MG PO TB24
25.0000 mg | ORAL_TABLET | Freq: Every day | ORAL | Status: DC
  Administered 2020-12-22: 25 mg via ORAL
  Filled 2020-12-21: qty 1

## 2020-12-21 MED ORDER — DOCUSATE SODIUM 100 MG PO CAPS: 100.0000 mg | ORAL_CAPSULE | Freq: Two times a day (BID) | ORAL | Status: DC

## 2020-12-21 MED ORDER — PHENOL 1.4 % MT LIQD: 1.0000 | OROMUCOSAL | Status: DC | PRN

## 2020-12-21 MED ORDER — PROPOFOL 10 MG/ML IV BOLUS
INTRAVENOUS | Status: AC
  Filled 2020-12-21: qty 20

## 2020-12-21 MED ORDER — CEFAZOLIN SODIUM-DEXTROSE 2-4 GM/100ML-% IV SOLN
2.0000 g | INTRAVENOUS | Status: AC
  Administered 2020-12-21: 2 g via INTRAVENOUS

## 2020-12-21 MED ORDER — LACTATED RINGERS IV BOLUS
500.0000 mL | Freq: Once | INTRAVENOUS | Status: AC
  Administered 2020-12-21: 500 mL via INTRAVENOUS

## 2020-12-21 MED ORDER — TRANEXAMIC ACID-NACL 1000-0.7 MG/100ML-% IV SOLN
INTRAVENOUS | Status: AC
  Filled 2020-12-21: qty 100

## 2020-12-21 MED ORDER — OXYCODONE-ACETAMINOPHEN 5-325 MG PO TABS: 1.0000 | ORAL_TABLET | ORAL | 0 refills | Status: DC | PRN

## 2020-12-21 MED ORDER — PROPOFOL 10 MG/ML IV BOLUS
INTRAVENOUS | Status: DC | PRN
  Administered 2020-12-21 (×2): 30 mg via INTRAVENOUS
  Administered 2020-12-21: 80 mg via INTRAVENOUS

## 2020-12-21 MED ORDER — ONDANSETRON HCL 4 MG/2ML IJ SOLN: 4.0000 mg | Freq: Four times a day (QID) | INTRAMUSCULAR | Status: DC | PRN

## 2020-12-21 MED ORDER — SUGAMMADEX SODIUM 200 MG/2ML IV SOLN
INTRAVENOUS | Status: DC | PRN
  Administered 2020-12-21: 200 mg via INTRAVENOUS

## 2020-12-21 MED ORDER — MAGNESIUM CITRATE PO SOLN: 1.0000 | Freq: Once | ORAL | Status: DC | PRN

## 2020-12-21 MED ORDER — METHOCARBAMOL 500 MG PO TABS
500.0000 mg | ORAL_TABLET | Freq: Four times a day (QID) | ORAL | Status: DC | PRN
  Administered 2020-12-22 (×2): 500 mg via ORAL
  Filled 2020-12-21 (×2): qty 1

## 2020-12-21 MED ORDER — FENTANYL CITRATE (PF) 100 MCG/2ML IJ SOLN
INTRAMUSCULAR | Status: AC
  Filled 2020-12-21: qty 2

## 2020-12-21 MED ORDER — PANTOPRAZOLE SODIUM 40 MG PO TBEC: 40.0000 mg | DELAYED_RELEASE_TABLET | Freq: Every day | ORAL | Status: DC

## 2020-12-21 MED ORDER — CYCLOBENZAPRINE HCL 10 MG PO TABS: 10.0000 mg | ORAL_TABLET | Freq: Three times a day (TID) | ORAL | 1 refills | Status: DC | PRN

## 2020-12-21 MED ORDER — ROCURONIUM BROMIDE 10 MG/ML (PF) SYRINGE
PREFILLED_SYRINGE | INTRAVENOUS | Status: DC | PRN
  Administered 2020-12-21: 60 mg via INTRAVENOUS

## 2020-12-21 MED ORDER — LEFLUNOMIDE 20 MG PO TABS
20.0000 mg | ORAL_TABLET | Freq: Every day | ORAL | Status: DC
  Administered 2020-12-22: 20 mg via ORAL
  Filled 2020-12-21: qty 1

## 2020-12-21 MED ORDER — BISACODYL 5 MG PO TBEC: 5.0000 mg | DELAYED_RELEASE_TABLET | Freq: Every day | ORAL | Status: DC | PRN

## 2020-12-21 MED ORDER — FENTANYL CITRATE (PF) 100 MCG/2ML IJ SOLN: 50.0000 ug | Freq: Once | INTRAMUSCULAR | Status: AC

## 2020-12-21 MED ORDER — TRANEXAMIC ACID-NACL 1000-0.7 MG/100ML-% IV SOLN
1000.0000 mg | INTRAVENOUS | Status: AC
  Administered 2020-12-21: 1000 mg via INTRAVENOUS

## 2020-12-21 MED ORDER — PAROXETINE HCL 20 MG PO TABS
30.0000 mg | ORAL_TABLET | Freq: Every day | ORAL | Status: DC
  Administered 2020-12-22: 30 mg via ORAL
  Filled 2020-12-21: qty 1

## 2020-12-21 MED ORDER — MIDAZOLAM HCL 2 MG/2ML IJ SOLN: 1.0000 mg | Freq: Once | INTRAMUSCULAR | Status: AC

## 2020-12-21 MED ORDER — METOCLOPRAMIDE HCL 5 MG/ML IJ SOLN: 5.0000 mg | Freq: Three times a day (TID) | INTRAMUSCULAR | Status: DC | PRN

## 2020-12-21 MED ORDER — MENTHOL 3 MG MT LOZG: 1.0000 | LOZENGE | OROMUCOSAL | Status: DC | PRN

## 2020-12-21 MED ORDER — FENTANYL CITRATE (PF) 100 MCG/2ML IJ SOLN
INTRAMUSCULAR | Status: AC
  Administered 2020-12-21: 50 ug via INTRAVENOUS
  Filled 2020-12-21: qty 2

## 2020-12-21 MED ORDER — ACETAMINOPHEN 10 MG/ML IV SOLN
1000.0000 mg | Freq: Once | INTRAVENOUS | Status: DC | PRN
  Administered 2020-12-21: 1000 mg via INTRAVENOUS

## 2020-12-21 MED ORDER — LOSARTAN POTASSIUM 50 MG PO TABS
50.0000 mg | ORAL_TABLET | Freq: Every day | ORAL | Status: DC
  Administered 2020-12-21 – 2020-12-22 (×2): 50 mg via ORAL
  Filled 2020-12-21 (×2): qty 1

## 2020-12-21 MED ORDER — ROCURONIUM BROMIDE 10 MG/ML (PF) SYRINGE
PREFILLED_SYRINGE | INTRAVENOUS | Status: AC
  Filled 2020-12-21: qty 10

## 2020-12-21 MED ORDER — VANCOMYCIN HCL 1000 MG IV SOLR
INTRAVENOUS | Status: AC
  Filled 2020-12-21: qty 1000

## 2020-12-21 MED ORDER — METHOCARBAMOL 1000 MG/10ML IJ SOLN
500.0000 mg | Freq: Four times a day (QID) | INTRAMUSCULAR | Status: DC | PRN
  Filled 2020-12-21: qty 5

## 2020-12-21 MED ORDER — MIDAZOLAM HCL 2 MG/2ML IJ SOLN
INTRAMUSCULAR | Status: AC
  Administered 2020-12-21: 1 mg via INTRAVENOUS
  Filled 2020-12-21: qty 2

## 2020-12-21 MED ORDER — ONDANSETRON HCL 4 MG/2ML IJ SOLN
4.0000 mg | Freq: Once | INTRAMUSCULAR | Status: DC | PRN
Start: 2020-12-21 — End: 2020-12-21

## 2020-12-21 MED ORDER — ONDANSETRON HCL 4 MG/2ML IJ SOLN
INTRAMUSCULAR | Status: DC | PRN
  Administered 2020-12-21: 4 mg via INTRAVENOUS

## 2020-12-21 MED ORDER — ONDANSETRON HCL 4 MG PO TABS: 4.0000 mg | ORAL_TABLET | Freq: Three times a day (TID) | ORAL | 0 refills | Status: DC | PRN

## 2020-12-21 MED ORDER — HYDROMORPHONE HCL 1 MG/ML IJ SOLN: 0.5000 mg | INTRAMUSCULAR | Status: DC | PRN

## 2020-12-21 MED ORDER — OXYCODONE HCL 5 MG PO TABS
5.0000 mg | ORAL_TABLET | Freq: Once | ORAL | Status: AC | PRN
Start: 2020-12-21 — End: 2020-12-21
  Administered 2020-12-21: 5 mg via ORAL

## 2020-12-21 MED ORDER — LIDOCAINE 2% (20 MG/ML) 5 ML SYRINGE
INTRAMUSCULAR | Status: DC | PRN
  Administered 2020-12-21: 40 mg via INTRAVENOUS

## 2020-12-21 MED ORDER — PANTOPRAZOLE SODIUM 40 MG PO TBEC
40.0000 mg | DELAYED_RELEASE_TABLET | Freq: Every day | ORAL | Status: DC
  Administered 2020-12-21 – 2020-12-22 (×2): 40 mg via ORAL
  Filled 2020-12-21 (×2): qty 1

## 2020-12-21 MED ORDER — DOCUSATE SODIUM 100 MG PO CAPS
100.0000 mg | ORAL_CAPSULE | Freq: Two times a day (BID) | ORAL | Status: DC
  Administered 2020-12-21 – 2020-12-22 (×2): 100 mg via ORAL
  Filled 2020-12-21 (×2): qty 1

## 2020-12-21 MED ORDER — FENTANYL CITRATE (PF) 100 MCG/2ML IJ SOLN
25.0000 ug | INTRAMUSCULAR | Status: DC | PRN
  Administered 2020-12-21 (×2): 50 ug via INTRAVENOUS

## 2020-12-21 MED ORDER — DIPHENHYDRAMINE HCL 12.5 MG/5ML PO ELIX: 12.5000 mg | ORAL_SOLUTION | ORAL | Status: DC | PRN

## 2020-12-21 MED ORDER — LABETALOL HCL 5 MG/ML IV SOLN
INTRAVENOUS | Status: DC | PRN
  Administered 2020-12-21 (×2): 5 mg via INTRAVENOUS

## 2020-12-21 MED ORDER — OXYCODONE HCL 5 MG/5ML PO SOLN: 5.0000 mg | Freq: Once | ORAL | Status: AC | PRN

## 2020-12-21 MED ORDER — VANCOMYCIN HCL 1000 MG IV SOLR
INTRAVENOUS | Status: DC | PRN
  Administered 2020-12-21: 1000 mg via TOPICAL

## 2020-12-21 MED ORDER — BUPIVACAINE HCL (PF) 0.5 % IJ SOLN
INTRAMUSCULAR | Status: DC | PRN
  Administered 2020-12-21: 15 mL via PERINEURAL

## 2020-12-21 MED ORDER — ALUM & MAG HYDROXIDE-SIMETH 200-200-20 MG/5ML PO SUSP: 30.0000 mL | ORAL | Status: DC | PRN

## 2020-12-21 MED ORDER — POLYETHYLENE GLYCOL 3350 17 G PO PACK: 17.0000 g | PACK | Freq: Every day | ORAL | Status: DC | PRN

## 2020-12-21 MED ORDER — DEXAMETHASONE SODIUM PHOSPHATE 10 MG/ML IJ SOLN
INTRAMUSCULAR | Status: DC | PRN
  Administered 2020-12-21: 5 mg via INTRAVENOUS

## 2020-12-21 MED ORDER — OXYCODONE HCL 5 MG PO TABS
5.0000 mg | ORAL_TABLET | ORAL | Status: DC | PRN
  Administered 2020-12-22 (×2): 5 mg via ORAL
  Filled 2020-12-21 (×2): qty 1

## 2020-12-21 MED ORDER — CEFAZOLIN SODIUM-DEXTROSE 2-4 GM/100ML-% IV SOLN
INTRAVENOUS | Status: AC
  Filled 2020-12-21: qty 100

## 2020-12-21 MED ORDER — OXYCODONE HCL 5 MG PO TABS
ORAL_TABLET | ORAL | Status: AC
  Filled 2020-12-21: qty 1

## 2020-12-21 MED ORDER — METOCLOPRAMIDE HCL 5 MG/ML IJ SOLN
5.0000 mg | Freq: Three times a day (TID) | INTRAMUSCULAR | Status: DC | PRN
Start: 2020-12-21 — End: 2020-12-21

## 2020-12-21 MED ORDER — PHENYLEPHRINE HCL-NACL 10-0.9 MG/250ML-% IV SOLN
INTRAVENOUS | Status: DC | PRN
  Administered 2020-12-21: 80 ug/min via INTRAVENOUS

## 2020-12-21 MED ORDER — CHLORHEXIDINE GLUCONATE 0.12 % MT SOLN
15.0000 mL | Freq: Once | OROMUCOSAL | Status: AC
  Administered 2020-12-21: 15 mL via OROMUCOSAL

## 2020-12-21 MED ORDER — ONDANSETRON HCL 4 MG PO TABS: 4.0000 mg | ORAL_TABLET | Freq: Four times a day (QID) | ORAL | Status: DC | PRN

## 2020-12-21 MED ORDER — BUPIVACAINE LIPOSOME 1.3 % IJ SUSP
INTRAMUSCULAR | Status: DC | PRN
  Administered 2020-12-21: 10 mL via PERINEURAL

## 2020-12-21 MED ORDER — LACTATED RINGERS IV BOLUS: 250.0000 mL | Freq: Once | INTRAVENOUS | Status: DC

## 2020-12-21 MED ORDER — APIXABAN 5 MG PO TABS
5.0000 mg | ORAL_TABLET | Freq: Two times a day (BID) | ORAL | Status: DC
  Administered 2020-12-22: 5 mg via ORAL
  Filled 2020-12-21: qty 1

## 2020-12-21 SURGICAL SUPPLY — 63 items
ADH SKN CLS APL DERMABOND .7 (GAUZE/BANDAGES/DRESSINGS) ×1
AID PSTN UNV HD RSTRNT DISP (MISCELLANEOUS) ×1
BLADE SAW SGTL 83.5X18.5 (BLADE) ×2 IMPLANT
BSPLAT GLND +2X24 MDLR (Joint) ×1 IMPLANT
COOLER ICEMAN CLASSIC (MISCELLANEOUS) ×1 IMPLANT
COVER BACK TABLE 60X90IN (DRAPES) ×2 IMPLANT
COVER SURGICAL LIGHT HANDLE (MISCELLANEOUS) ×2 IMPLANT
CUP SUT UNIV REVERS 36 NEUTRAL (Cup) ×1 IMPLANT
DERMABOND ADVANCED (GAUZE/BANDAGES/DRESSINGS) ×1
DERMABOND ADVANCED .7 DNX12 (GAUZE/BANDAGES/DRESSINGS) IMPLANT
DRAPE INCISE IOBAN 66X45 STRL (DRAPES) ×1 IMPLANT
DRAPE ORTHO SPLIT 77X108 STRL (DRAPES) ×4
DRAPE SHEET LG 3/4 BI-LAMINATE (DRAPES) ×2 IMPLANT
DRAPE SURG 17X11 SM STRL (DRAPES) ×2 IMPLANT
DRAPE SURG ORHT 6 SPLT 77X108 (DRAPES) ×2 IMPLANT
DRAPE U-SHAPE 47X51 STRL (DRAPES) ×2 IMPLANT
DRSG AQUACEL AG ADV 3.5X 6 (GAUZE/BANDAGES/DRESSINGS) ×1 IMPLANT
DURAPREP 26ML APPLICATOR (WOUND CARE) ×2 IMPLANT
ELECT BLADE TIP CTD 4 INCH (ELECTRODE) ×2 IMPLANT
ELECT REM PT RETURN 15FT ADLT (MISCELLANEOUS) ×2 IMPLANT
FACESHIELD WRAPAROUND (MASK) ×6 IMPLANT
FACESHIELD WRAPAROUND OR TEAM (MASK) ×4 IMPLANT
GLENOID UNI REV MOD 24 +2 LAT (Joint) ×1 IMPLANT
GLENOSPHERE 36 +4 LAT/24 (Joint) ×1 IMPLANT
GLOVE SURG ENC MOIS LTX SZ7.5 (GLOVE) ×2 IMPLANT
GLOVE SURG ENC MOIS LTX SZ8 (GLOVE) ×2 IMPLANT
GLOVE SURG MICRO LTX SZ7 (GLOVE) ×2 IMPLANT
GLOVE SURG MICRO LTX SZ7.5 (GLOVE) ×2 IMPLANT
GLOVE SURG SYN 7.0 (GLOVE) IMPLANT
GLOVE SURG SYN 7.0 PF PI (GLOVE) IMPLANT
GLOVE SURG SYN 7.5  E (GLOVE)
GLOVE SURG SYN 7.5 E (GLOVE) IMPLANT
GLOVE SURG SYN 7.5 PF PI (GLOVE) IMPLANT
GLOVE SURG SYN 8.0 (GLOVE) IMPLANT
GLOVE SURG SYN 8.0 PF PI (GLOVE) IMPLANT
GOWN STRL REUS W/TWL LRG LVL3 (GOWN DISPOSABLE) ×4 IMPLANT
KIT BASIN OR (CUSTOM PROCEDURE TRAY) ×2 IMPLANT
KIT TURNOVER KIT A (KITS) ×2 IMPLANT
LINER HUMERAL 36 +3MM SM (Shoulder) ×1 IMPLANT
MANIFOLD NEPTUNE II (INSTRUMENTS) ×2 IMPLANT
NS IRRIG 1000ML POUR BTL (IV SOLUTION) ×2 IMPLANT
PACK SHOULDER (CUSTOM PROCEDURE TRAY) ×2 IMPLANT
PAD ARMBOARD 7.5X6 YLW CONV (MISCELLANEOUS) ×2 IMPLANT
PAD COLD SHLDR WRAP-ON (PAD) ×1 IMPLANT
RESTRAINT HEAD UNIVERSAL NS (MISCELLANEOUS) ×2 IMPLANT
SCREW CENTRAL MODULAR 25 (Screw) ×1 IMPLANT
SCREW PERIPHERAL 5.5X20 LOCK (Screw) ×2 IMPLANT
SCREW PERIPHERAL 5.5X28 LOCK (Screw) ×2 IMPLANT
SLING ARM FOAM STRAP MED (SOFTGOODS) ×1 IMPLANT
SPACER SHLD UNI REV 36 +6 (Shoulder) ×1 IMPLANT
STEM HUMERAL UNI REVERS SZ6 (Stem) ×1 IMPLANT
SUCTION FRAZIER HANDLE 12FR (TUBING) ×2
SUCTION TUBE FRAZIER 12FR DISP (TUBING) ×1 IMPLANT
SUT FIBERWIRE #2 38 T-5 BLUE (SUTURE)
SUT MNCRL AB 3-0 PS2 18 (SUTURE) ×2 IMPLANT
SUT MON AB 2-0 CT1 36 (SUTURE) ×2 IMPLANT
SUT VIC AB 1 CT1 36 (SUTURE) ×2 IMPLANT
SUTURE FIBERWR #2 38 T-5 BLUE (SUTURE) IMPLANT
SUTURE TAPE 1.3 40 TPR END (SUTURE) ×2 IMPLANT
SUTURETAPE 1.3 40 TPR END (SUTURE) ×4
TOWEL OR 17X26 10 PK STRL BLUE (TOWEL DISPOSABLE) ×2 IMPLANT
TOWEL OR NON WOVEN STRL DISP B (DISPOSABLE) ×2 IMPLANT
WATER STERILE IRR 1000ML POUR (IV SOLUTION) ×4 IMPLANT

## 2020-12-21 NOTE — H&P (Signed)
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 female with a long history of rheumatoid arthritis, on disease modifying agents, now with progressive increasing left shoulder pain and recurrent instability secondary to severe rheumatoid arthropathy.  Due to her increasing functional imitations including her recurrent instability she is brought to the operating room this time for planned left shoulder reverse arthroplasty  Past Medical History:  Diagnosis Date  . Anemia   . Arthritis   . Cancer (HCC)    skin,breast  . Depression   . Dysrhythmia   . Hypertension   . Hypothyroidism   . Pre-diabetes     Past Surgical History:  Procedure Laterality Date  . BREAST SURGERY     CANCER  . CHOLECYSTECTOMY    . GASTRIC RESTRICTION SURGERY    . HERNIA REPAIR    . JOINT REPLACEMENT     KNEE    Family History  Problem Relation Age of Onset  . Arthritis Mother   . Hypertension Mother   . Cancer Father        CANCER    Social History:  reports that she has never smoked. She has never used smokeless tobacco. She reports that she does not drink alcohol and does not use drugs.   Medications Prior to Admission  Medication Sig Dispense Refill  . acetaminophen (TYLENOL) 650 MG CR tablet Take 650-1,300 mg by mouth every 8 (eight) hours as needed for pain.    Marland Kitchen apixaban (ELIQUIS) 5 MG TABS tablet Take 5 mg by mouth 2 (two) times daily.    Marland Kitchen atorvastatin (LIPITOR) 40 MG tablet Take 40 mg by mouth daily.    . Calcium Carb-Cholecalciferol (CALCIUM 600 + D PO) Take 1 tablet by mouth daily.    . furosemide (LASIX) 20 MG tablet Take 20 mg by mouth daily as needed for edema.    Marland Kitchen golimumab (SIMPONI ARIA) 50 MG/4ML SOLN injection Inject 50 mg into the vein every 8 (eight) weeks.    Marland Kitchen leflunomide (ARAVA) 20 MG tablet Take 20 mg by mouth daily.    Marland Kitchen levothyroxine (SYNTHROID, LEVOTHROID) 25 MCG  tablet Take 25 mcg by mouth daily.    Marland Kitchen loratadine (CLARITIN) 10 MG tablet Take 10 mg by mouth daily as needed for allergies.    Marland Kitchen losartan (COZAAR) 50 MG tablet Take 50 mg by mouth daily.    Marland Kitchen METAMUCIL FIBER PO Take 1 capsule by mouth daily as needed (constipation).    . metoprolol succinate (TOPROL-XL) 25 MG 24 hr tablet Take 25 mg by mouth daily.    . Multiple Vitamins-Minerals (CENTRUM SILVER PO) Take by mouth daily.    Marland Kitchen PARoxetine (PAXIL) 30 MG tablet Take 30 mg by mouth every morning.    . trolamine salicylate (ASPERCREME) 10 % cream Apply 1 application topically as needed for muscle pain.    . vitamin B-12 (CYANOCOBALAMIN) 1000 MCG tablet Take 1,000 mcg by mouth daily.       Physical Exam: Patient is a petite, slender female demonstrating multiple stigmata of advanced rheumatoid arthropathy including severe ulnar drift of the digits with swelling about the metacarpal phalangeal joints.  Left shoulder reveals moderate effusion with severe pain on attempted motion.  Global weakness to manual muscle testing.  Radiographs  Plain films of the left shoulder confirm changes consistent with advanced rheumatoid arthropathy.  She has had several dislocation episodes.  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

## 2020-12-21 NOTE — Discharge Instructions (Signed)
 Deborah Frazier, M.D., F.A.A.O.S. Orthopaedic Surgery Specializing in Arthroscopic and Reconstructive Surgery of the Shoulder [phone] [address] - [place], [place] [address] - Fax [phone]   POST-OP TOTAL SHOULDER REPLACEMENT INSTRUCTIONS  1. Follow up in the office for your first post-op appointment 10-14 days from the date of your surgery. If you do not already have a scheduled appointment, our office will contact you to schedule.  2. The bandage over your incision is waterproof. You may begin showering with this dressing on. You may leave this dressing on until first follow up appointment within 2 weeks. We prefer you leave this dressing in place until follow up however after 5-7 days if you are having itching or skin irritation and would like to remove it you may do so. Go slow and tug at the borders gently to break the bond the dressing has with the skin. At this point if there is no drainage it is okay to go without a bandage or you may cover it with a light guaze and tape. You can also expect significant bruising around your shoulder that will drift down your arm and into your chest wall. This is very normal and should resolve over several days.   3. Wear your sling/immobilizer at all times except to perform the exercises below or to occasionally let your arm dangle by your side to stretch your elbow. You also need to sleep in your sling immobilizer until instructed otherwise. It is ok to remove your sling if you are sitting in a controlled environment and allow your arm to rest in a position of comfort by your side or on your lap with pillows to give your neck and skin a break from the sling. You may remove it to allow arm to dangle by side to shower. If you are up walking around and when you go to sleep at night you need to wear it.  4. Range of motion to your elbow, wrist, and hand are encouraged 3-5 times daily. Exercise to your hand and fingers helps to reduce  swelling you may experience.   5. Prescriptions for a pain medication and a muscle relaxant are provided for you. It is recommended that if you are experiencing pain that you pain medication alone is not controlling, add the muscle relaxant along with the pain medication which can give additional pain relief. The first 1-2 days is generally the most severe of your pain and then should gradually decrease. As your pain lessens it is recommended that you decrease your use of the pain medications to an "as needed basis'" only and to always comply with the recommended dosages of the pain medications.  6. Pain medications can produce constipation along with their use. If you experience this, the use of an over the counter stool softener or laxative daily is recommended.   7. For additional questions or concerns, please do not hesitate to call the office. If after hours there is an answering service to forward your concerns to the physician on call.  8.Pain control following an exparel block  To help control your post-operative pain you received a nerve block  performed with Exparel which is a long acting anesthetic (numbing agent) which can provide pain relief and sensations of numbness (and relief of pain) in the operative shoulder and arm for up to 3 days. Sometimes it provides mixed relief, meaning you may still have numbness in certain areas of the arm but can still be able to   move  parts of that arm, hand, and fingers. We recommend that your prescribed pain medications  be used as needed. We do not feel it is necessary to "pre medicate" and "stay ahead" of pain.  Taking narcotic pain medications when you are not having any pain can lead to unnecessary and potentially dangerous side effects.    9. Use the ice machine as much as possible in the first 5-7 days from surgery, then you can wean its use to as needed. The ice typically needs to be replaced every 6 hours, instead of ice you can actually freeze  water bottles to put in the cooler and then fill water around them to avoid having to purchase ice. You can have spare water bottles freezing to allow you to rotate them once they have melted. Try to have a thin shirt or light cloth or towel under the ice wrap to protect your skin.   FOR ADDITIONAL INFO ON ICE MACHINE AND INSTRUCTIONS GO TO THE WEBSITE AT  [email or web address]/[email or web address]  10.  We recommend that you avoid any dental work or cleaning in the first 3 months following your joint replacement. This is to help minimize the possibility of infection from the bacteria in your mouth that enters your bloodstream during dental work. We also recommend that you take an antibiotic prior to your dental work for the first year after your shoulder replacement to further help reduce that risk. Please simply contact our office for antibiotics to be sent to your pharmacy prior to dental work.  11. Dental Antibiotics:  In most cases prophylactic antibiotics for Dental procdeures after total joint surgery are not necessary.  Exceptions are as follows:  1. History of prior total joint infection  2. Severely immunocompromised (Organ Transplant, cancer chemotherapy, Rheumatoid biologic meds such as Humera)  3. Poorly controlled diabetes (A1C &gt; 8.0, blood glucose over 200)  If you have one of these conditions, contact your surgeon for an antibiotic prescription, prior to your dental procedure.   POST-OP EXERCISES  Pendulum Exercises  Perform pendulum exercises while standing and bending at the waist. Support your uninvolved arm on a table or chair and allow your operated arm to hang freely. Make sure to do these exercises passively - not using you shoulder muscles. These exercises can be performed once your nerve block effects have worn off.  Repeat 20 times. Do 3 sessions per day.     

## 2020-12-21 NOTE — Progress Notes (Signed)
Assisted Dr. Greg Stoltzfus with left, ultrasound guided, interscalene  block. Side rails up, monitors on throughout procedure. See vital signs in flow sheet. Tolerated Procedure well. 

## 2020-12-21 NOTE — Anesthesia Procedure Notes (Signed)
Procedure Name: Intubation Performed by: Milford Cage, CRNA Pre-anesthesia Checklist: Patient identified, Emergency Drugs available, Suction available and Patient being monitored Patient Re-evaluated:Patient Re-evaluated prior to induction Oxygen Delivery Method: Circle System Utilized Preoxygenation: Pre-oxygenation with 100% oxygen Induction Type: IV induction Ventilation: Mask ventilation without difficulty Laryngoscope Size: Mac and 3 Grade View: Grade I Tube type: Oral Tube size: 6.5 mm Number of attempts: 1 Airway Equipment and Method: Stylet and Oral airway Placement Confirmation: ETT inserted through vocal cords under direct vision,  positive ETCO2 and breath sounds checked- equal and bilateral Secured at: 21 cm Tube secured with: Tape Dental Injury: Teeth and Oropharynx as per pre-operative assessment

## 2020-12-21 NOTE — Evaluation (Signed)
Occupational Therapy Evaluation Patient Details Name: Deborah Frazier MRN: [id number] DOB: 10/07/1945 Today's Date: 12/21/2020    History of Present Illness Patient is a 75 year old female admitted 4/28 for L reverse total shoulder arthroplasty. PMH includes RA, L shoulder dislocations   Clinical Impression   Patient is a 75 year old female admitted for shoulder replacement resulting in limited functional use of left non- dominant upper extremity secondary to effects of surgery, interscalene block and shoulder precautions. Therapist provided education and instruction to patient in regards to exercises, precautions, positioning, donning upper extremity clothing and bathing while maintaining shoulder precautions, ice and edema management and donning/doffing sling. Patient verbalize understanding. Patient to follow up with MD for further therapy needs. Will follow up 4/29 if patient admitted overnight to reinforce and educate patient family re: precautions and assist patient with dressing.     Follow Up Recommendations  Follow surgeon's recommendation for DC plan and follow-up therapies    Equipment Recommendations  None recommended by OT       Precautions / Restrictions Precautions Precautions: Shoulder Type of Shoulder Precautions: If sitting in controlled environment, ok to come out of sling to give neck a break. Please sleep in it to protect until follow up in office.     OK to use operative arm for feeding, hygiene and ADLs. Ok to instruct Pendulums and lap slides as exercises. Ok to use operative arm within the following parameters for ADL purposes New ROM (8/18) Ok for PROM, AAROM, AROM within pain tolerance and within the following ROM ER 20 ABD 45 FE 60. AROM elbow, wrist, hand ok Shoulder Interventions: Shoulder sling/immobilizer;Off for dressing/bathing/exercises Precaution Booklet Issued: Yes (comment) Required Braces or Orthoses: Sling Restrictions Weight Bearing  Restrictions: Yes LUE Weight Bearing: Non weight bearing             ADL either performed or assessed with clinical judgement   ADL                                         General ADL Comments: patient educated in compensatory strategies in order to perform self care tasks while maintaining shoulder precautions. Patient verbalize understanding                  Pertinent Vitals/Pain Pain Assessment: Faces Faces Pain Scale: Hurts a little bit Pain Location: L shoulder Pain Descriptors / Indicators: Sore Pain Intervention(s): Monitored during session     Hand Dominance Right   Extremity/Trunk Assessment Upper Extremity Assessment Upper Extremity Assessment: LUE deficits/detail LUE: Unable to fully assess due to immobilization   Lower Extremity Assessment Lower Extremity Assessment: Overall WFL for tasks assessed       Communication Communication Communication: No difficulties   Cognition Arousal/Alertness: Awake/alert Behavior During Therapy: WFL for tasks assessed/performed Overall Cognitive Status: Within Functional Limits for tasks assessed                                        Exercises Exercises: Shoulder   Shoulder Instructions Shoulder Instructions Donning/doffing shirt without moving shoulder: Patient able to independently direct caregiver Method for sponge bathing under operated UE: Patient able to independently direct caregiver Donning/doffing sling/immobilizer: Patient able to independently direct caregiver Correct positioning of sling/immobilizer: Patient able to independently direct caregiver Pendulum exercises (written home  exercise program): Patient able to independently direct caregiver ROM for elbow, wrist and digits of operated UE: Patient able to independently direct caregiver Sling wearing schedule (on at all times/off for ADL's): Patient able to independently direct caregiver Proper positioning of operated  UE when showering: Patient able to independently direct caregiver Dressing change:  (N/A) Positioning of UE while sleeping: Patient able to independently direct caregiver    Home Living Family/patient expects to be discharged to:: Private residence Living Arrangements: Spouse/significant other;Children Available Help at Discharge: Family;Available 24 hours/day                                    Prior Functioning/Environment Level of Independence: Independent                 OT Problem List: Pain;Impaired UE functional use      OT Treatment/Interventions: Self-care/ADL training;Therapeutic exercise;Patient/family education;Balance training;Therapeutic activities    OT Goals(Current goals can be found in the care plan section) Acute Rehab OT Goals Patient Stated Goal: less pain OT Goal Formulation: With patient Time For Goal Achievement: 01/04/21 Potential to Achieve Goals: Good  OT Frequency: Min 2X/week    AM-PAC OT "6 Clicks" Daily Activity     Outcome Measure Help from another person eating meals?: None Help from another person taking care of personal grooming?: None Help from another person toileting, which includes using toliet, bedpan, or urinal?: A Little Help from another person bathing (including washing, rinsing, drying)?: A Little Help from another person to put on and taking off regular upper body clothing?: A Little Help from another person to put on and taking off regular lower body clothing?: A Little 6 Click Score: 20   End of Session Nurse Communication: Other (comment) (OT complete)  Activity Tolerance: Patient tolerated treatment well Patient left: in bed;with call bell/phone within reach  OT Visit Diagnosis: Pain Pain - Right/Left: Left Pain - part of body: Shoulder                Time: 0737-1062 OT Time Calculation (min): 20 min Charges:  OT General Charges $OT Visit: 1 Visit OT Evaluation $OT Eval Low Complexity: 1  Low  Delbert Phenix OT OT pager: [place] 12/21/2020, 1:56 PM

## 2020-12-21 NOTE — Transfer of Care (Signed)
Immediate Anesthesia Transfer of Care Note  Patient: Deborah Frazier  Procedure(s) Performed: REVERSE SHOULDER ARTHROPLASTY (Left Shoulder)  Patient Location: PACU  Anesthesia Type:General  Level of Consciousness: drowsy  Airway & Oxygen Therapy: Patient Spontanous Breathing and Patient connected to nasal cannula oxygen  Post-op Assessment: Report given to RN and Post -op Vital signs reviewed and stable  Post vital signs: Reviewed and stable  Last Vitals:  Vitals Value Taken Time  BP 124/74 12/21/20 1615  Temp    Pulse 61 12/21/20 1617  Resp 19 12/21/20 1618  SpO2 94 % 12/21/20 1617  Vitals shown include unvalidated device data.  Last Pain:  Vitals:   12/21/20 1358  TempSrc:   PainSc: 0-No pain         Complications: No complications documented.

## 2020-12-21 NOTE — Anesthesia Procedure Notes (Signed)
Anesthesia Regional Block: Interscalene brachial plexus block   Pre-Anesthetic Checklist: ,, timeout performed, Correct Patient, Correct Site, Correct Laterality, Correct Procedure, Correct Position, site marked, Risks and benefits discussed,  Surgical consent,  Pre-op evaluation,  At surgeon's request and post-op pain management  Laterality: Left  Prep: Dura Prep       Needles:  Injection technique: Single-shot  Needle Type: Echogenic Stimulator Needle     Needle Length: 5cm  Needle Gauge: 20     Additional Needles:   Procedures:,,,, ultrasound used (permanent image in chart),,,,  Narrative:  Start time: 12/21/2020 1:53 PM End time: 12/21/2020 1:57 PM Injection made incrementally with aspirations every 5 mL.  Performed by: Personally  Anesthesiologist: Darral Dash, DO  Additional Notes: Patient identified. Risks/Benefits/Options discussed with patient including but not limited to bleeding, infection, nerve damage, failed block, incomplete pain control. Patient expressed understanding and wished to proceed. All questions were answered. Sterile technique was used throughout the entire procedure. Please see nursing notes for vital signs. Aspirated in 5cc intervals with injection for negative confirmation. Patient was given instructions on fall risk and not to get out of bed. All questions and concerns addressed with instructions to call with any issues or inadequate analgesia.

## 2020-12-21 NOTE — Anesthesia Postprocedure Evaluation (Signed)
Anesthesia Post Note  Patient: Lenis Dickinson  Procedure(s) Performed: REVERSE SHOULDER ARTHROPLASTY (Left Shoulder)     Patient location during evaluation: PACU Anesthesia Type: Regional and General Level of consciousness: awake and alert Pain management: pain level controlled Vital Signs Assessment: post-procedure vital signs reviewed and stable Respiratory status: spontaneous breathing, nonlabored ventilation, respiratory function stable and patient connected to nasal cannula oxygen Cardiovascular status: blood pressure returned to baseline and stable Postop Assessment: no apparent nausea or vomiting Anesthetic complications: no   No complications documented.  Last Vitals:  Vitals:   12/21/20 1745 12/21/20 1800  BP: 112/67 107/64  Pulse: 64 62  Resp: 13 15  Temp:  36.5 C  SpO2: 94% 94%    Last Pain:  Vitals:   12/21/20 1700  TempSrc:   PainSc: 7                  Joslyne Marshburn [address]

## 2020-12-21 NOTE — Anesthesia Preprocedure Evaluation (Signed)
Anesthesia Evaluation  Patient identified by MRN, date of birth, ID band Patient awake    Reviewed: Allergy & Precautions, NPO status , Patient's Chart, lab work & pertinent test results  Airway Mallampati: II  TM Distance: >3 FB Neck ROM: Full    Dental  (+) Teeth Intact   Pulmonary neg pulmonary ROS,    Pulmonary exam normal        Cardiovascular hypertension, Pt. on medications and Pt. on home beta blockers + dysrhythmias Atrial Fibrillation  Rhythm:Irregular Rate:Normal     Neuro/Psych Depression negative neurological ROS     GI/Hepatic negative GI ROS, Neg liver ROS,   Endo/Other  Hypothyroidism   Renal/GU negative Renal ROS  negative genitourinary   Musculoskeletal  (+) Arthritis , Osteoarthritis,    Abdominal (+)  Abdomen: soft. Bowel sounds: normal.  Peds  Hematology  (+) anemia ,   Anesthesia Other Findings   Reproductive/Obstetrics                            Anesthesia Physical Anesthesia Plan  ASA: III  Anesthesia Plan: General and Regional   Post-op Pain Management:  Regional for Post-op pain   Induction: Intravenous  PONV Risk Score and Plan: 3 and Ondansetron, Dexamethasone, Treatment may vary due to age or medical condition and Midazolam  Airway Management Planned: Mask and Oral ETT  Additional Equipment: None  Intra-op Plan:   Post-operative Plan: Extubation in OR  Informed Consent: I have reviewed the patients History and Physical, chart, labs and discussed the procedure including the risks, benefits and alternatives for the proposed anesthesia with the patient or authorized representative who has indicated his/her understanding and acceptance.     Dental advisory given  Plan Discussed with: CRNA  Anesthesia Plan Comments: (Lab Results      Component                Value               Date                      WBC                      5.2                  12/18/2020                HGB                      12.0                12/18/2020                HCT                      37.6                12/18/2020                MCV                      100.3 (H)           12/18/2020                PLT  209                 12/18/2020           Lab Results      Component                Value               Date                      NA                       142                 12/18/2020                K                        5.0                 12/18/2020                CO2                      26                  12/18/2020                GLUCOSE                  99                  12/18/2020                BUN                      16                  12/18/2020                CREATININE               0.62                12/18/2020                CALCIUM                  9.2                 12/18/2020                GFRNONAA                 >60                 12/18/2020                GFRAA                                        01/10/2010            >60        The eGFR has been calculated using the MDRD equation. This calculation has not been validated in all clinical situations. eGFR's persistently <60 mL/min signify possible Chronic Kidney  Disease.)        Anesthesia Quick Evaluation

## 2020-12-21 NOTE — Op Note (Signed)
12/21/2020  3:42 PM  PATIENT:   Deborah Frazier  75 y.o. 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  ASSISTANTS: Jenetta Loges, PA-C  ANESTHESIA:   General endotracheal and interscalene block with Exparel  EBL: 200 cc  SPECIMEN: None  Drains: None   PATIENT DISPOSITION:  PACU - hemodynamically stable.    PLAN OF CARE: Discharge to home after PACU  Brief history:  Deborah Frazier is a 75 year old female with a long history of rheumatoid arthritis with multiple musculoskeletal manifestations of her chronic rheumatologic disease who presents with increasing pain and functional rotations related to severe left shoulder rheumatoid/osteoarthritis with chronic instability.  She is having severe pain and functional rotations and she is brought to the operating room at this time for planned left shoulder reverse arthroplasty.  Preoperatively, I counseled the patient regarding treatment options and risks versus benefits thereof.  Possible surgical complications were all reviewed including potential for bleeding, infection, neurovascular injury, persistent pain, loss of motion, anesthetic complication, failure of the implant, and possible need for additional surgery. They understand and accept and agrees with our planned procedure.   Procedure in detail:  After undergoing routine preop evaluation the patient received prophylactic antibiotics and interscalene block with Exparel was established in the holding area by the anesthesia department.  Subsequently placed supine on the operating table and underwent the smooth induction of a general endotracheal  anesthesia.  Placed in the beachchair position and appropriately padded and protected.  Left shoulder girdle region was sterilely prepped and draped in standard fashion.  Timeout was called.  A deltopectoral approach left shoulder is made through an approximate centimeter incision.  Skin flaps were elevated dissection carried deeply and the deltopectoral interval was developed from proximal to distal with the vein taken laterally.  Conjoined tendon mobilized and retracted medially.  The long head biceps tendon was tenodesed at the upper border the pectoralis major tendon the proximal segment was unroofed and excised.  Rotator cuff found to be complete deficient superiorly.  Remnant of the subscapularis was mobilized and divided off the lesser tuberosity with a free margin tagged with a pair of suture tape sutures and then capsular attachments were divided on the anterior and infra margins of the humeral neck allowing delivery of the humeral head through the wound.  An extra medullary guide was then used to outline our proposed humeral head resection which was performed with an oscillating saw at approximately 30 degrees retroversion.  A metal cap was then placed at the cut proximal humeral surface we then exposed the glenoid with appropriate retractors and performed a circumferential labral resection as well as a significant synovectomy.  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  With exposure of the glenoid a central guidepin was then placed with an approximate 5 degree inferior tilt and the glenoid was then reamed with the central and peripheral reamers to a stable subchondral bony bed.  All residual soft tissue and bone debris was carefully removed.  Preparation completed with the central drill and tap.  25 mm lag screw assembled to the baseplate baseplate was then inserted with vancomycin powder on the threads of the lag screw and good purchase and fixation  was  achieved.  The peripheral locking screws were all then placed using standard technique.  Good fixation was achieved.  A 36/+4 glenosphere was then impacted onto the baseplate and the central locking screw was placed.  Attention then turned back to the metaphysis where the canal was opened and we broached up to a size 6 which showed good fit and fixation.  Neutral metaphyseal reamer was then utilized.  There was noted to be some cystic areas within the metaphysis and so we harvested bone from the resected humeral head used for bone grafting once the final implant was impacted.  A trial reduction was performed showing good motion good stability good soft tissue balance.  At this time the final implant was then assembled.  Vancomycin powder was spread liberally into the humeral canal.  The harvested bone graft was then placed into the metaphysis and then the final implant was impacted with excellent fit and fixation.  We then performed a series of trial reductions and ultimately found the +6 spacer with a +3 polygave [place] the best soft tissue balance with good motion good stability.  The final construct was assembled and final reduction was performed again showing good motion stability and soft tissue balance much to our satisfaction.  The wound was then copiously irrigated.  We confirmed good elasticity of the remaining subscapularis and this was then repaired back to the eyelets on the collar implant.  The arm easily achieved 45 degrees of external rotation without excessive tension on the subscap repair.  Final irrigation was completed.  Hemostasis was obtained.  The balance of the vancomycin powder was then placed liberally into the deep soft tissue layers.  The deltopectoral interval was reapproximated with a series of figure-of-eight and 1 Vicryl sutures.  2-0 Monocryl used for the subcu layer and intracuticular 3-0 Monocryl for the skin followed by Dermabond and Aquacel dressing.  Left arm was then placed in the  sling and the patient was awakened, extubated, and taken to the recovery room in stable addition.  Jenetta Loges, PA-C was utilized as an Environmental consultant throughout this case, essential for help with positioning the patient, positioning extremity, tissue manipulation, implantation of the prosthesis, suture management, wound closure, and intraoperative decision-making.  Marin Shutter MD   Contact # [phone]

## 2020-12-22 ENCOUNTER — Encounter (HOSPITAL_COMMUNITY): Payer: Self-pay | Admitting: Orthopedic Surgery

## 2020-12-22 DIAGNOSIS — M06012 Rheumatoid arthritis without rheumatoid factor, left shoulder: Secondary | ICD-10-CM | POA: Diagnosis not present

## 2020-12-22 MED ORDER — TRAMADOL HCL 50 MG PO TABS: 50.0000 mg | ORAL_TABLET | Freq: Four times a day (QID) | ORAL | 0 refills | Status: DC | PRN

## 2020-12-22 NOTE — Progress Notes (Signed)
Occupational Therapy Treatment Patient Details Name: Deborah Frazier MRN: [id number] DOB: 1946-01-19 Today's Date: 12/22/2020    History of present illness Patient is a 75 year old female admitted 4/28 for L reverse total shoulder arthroplasty. PMH includes RA, L shoulder dislocations   OT comments  Patient spouse present for education regarding compensatory strategies for self care and exercises in order to safely maintain shoulder precautions. Overall patient supervision for ambulation/transfers due to mild unsteadiness, mod A for donning tee shirt with min cues for technique and min A for LB dressing to doff/don socks. Patient/spouse verbalize and demo as needed understanding of all instructions. No further acute OT services, patient to D/C today.   Follow Up Recommendations  Follow surgeon's recommendation for DC plan and follow-up therapies    Equipment Recommendations  None recommended by OT       Precautions / Restrictions Precautions Precautions: Shoulder Type of Shoulder Precautions: If sitting in controlled environment, ok to come out of sling to give neck a break. Please sleep in it to protect until follow up in office.     OK to use operative arm for feeding, hygiene and ADLs. Ok to instruct Pendulums and lap slides as exercises. Ok to use operative arm within the following parameters for ADL purposes New ROM (8/18) Ok for PROM, AAROM, AROM within pain tolerance and within the following ROM ER 20 ABD 45 FE 60. AROM elbow, wrist, hand ok Shoulder Interventions: Shoulder sling/immobilizer;Off for dressing/bathing/exercises Precaution Booklet Issued: Yes (comment) Required Braces or Orthoses: Sling Restrictions Weight Bearing Restrictions: Yes LUE Weight Bearing: Non weight bearing       Mobility Bed Mobility Overal bed mobility: Needs Assistance Bed Mobility: Supine to Sit     Supine to sit: Supervision;HOB elevated     General bed mobility comments: increased  time, use of bed rail. educate pt/spouse how to position pillows in bed or recliner for sleeping    Transfers Overall transfer level: Needs assistance Equipment used: None Transfers: Sit to/from Stand Sit to Stand: Supervision         General transfer comment: S for safety    Balance Overall balance assessment: Mild deficits observed, not formally tested                                         ADL either performed or assessed with clinical judgement   ADL Overall ADL's : Needs assistance/impaired     Grooming: Wash/dry hands;Independent;Standing   Upper Body Bathing: Supervision/ safety;Standing Upper Body Bathing Details (indicate cue type and reason): to wash under arms Lower Body Bathing: Minimal assistance;Sitting/lateral leans;Sit to/from stand   Upper Body Dressing : Moderate assistance;Cueing for sequencing;Standing Upper Body Dressing Details (indicate cue type and reason): cue patient to let L arm dangle in order to better thread UE through shirt Lower Body Dressing: Minimal assistance;Sit to/from stand Lower Body Dressing Details (indicate cue type and reason): patient able to don underwear and pants, needed assist with socks Toilet Transfer: Supervision/safety;Ambulation;Regular Glass blower/designer Details (indicate cue type and reason): patient mildly unsteady, educate pt/spouse have spouse close by first few days post surgery Toileting- Clothing Manipulation and Hygiene: Supervision/safety;Sit to/from stand       Functional mobility during ADLs: Supervision/safety General ADL Comments: spouse present for session, educated on compensatory strategies in order to perform self care tasks while maintaining precautions. also instructed how to assist with  exercises as needed .               Cognition Arousal/Alertness: Awake/alert Behavior During Therapy: WFL for tasks assessed/performed Overall Cognitive Status: Within Functional Limits  for tasks assessed                                          Exercises Exercises: Shoulder   Shoulder Instructions Shoulder Instructions Donning/doffing shirt without moving shoulder: Moderate assistance;Patient able to independently direct caregiver;Caregiver independent with task Method for sponge bathing under operated UE: Supervision/safety;Patient able to independently direct caregiver Donning/doffing sling/immobilizer: Moderate assistance;Patient able to independently direct caregiver;Caregiver independent with task Correct positioning of sling/immobilizer: Caregiver independent with task;Patient able to independently direct caregiver Pendulum exercises (written home exercise program): Caregiver independent with task;Patient able to independently direct caregiver ROM for elbow, wrist and digits of operated UE: Caregiver independent with task;Patient able to independently direct caregiver Sling wearing schedule (on at all times/off for ADL's): Caregiver independent with task;Patient able to independently direct caregiver Proper positioning of operated UE when showering: Caregiver independent with task;Patient able to independently direct caregiver Positioning of UE while sleeping: Caregiver independent with task;Patient able to independently direct caregiver          Pertinent Vitals/ Pain       Pain Assessment: Faces Faces Pain Scale: Hurts little more Pain Location: L shoulder Pain Descriptors / Indicators: Sore Pain Intervention(s): Monitored during session;Premedicated before session      Progress Toward Goals  OT Goals(current goals can now be found in the care plan section)  Progress towards OT goals: Goals met/education completed, patient discharged from OT  Acute Rehab OT Goals Patient Stated Goal: less pain OT Goal Formulation: All assessment and education complete, DC therapy  Plan All goals met and education completed, patient discharged from OT  services       AM-PAC OT "6 Clicks" Daily Activity     Outcome Measure   Help from another person eating meals?: None Help from another person taking care of personal grooming?: None Help from another person toileting, which includes using toliet, bedpan, or urinal?: A Little Help from another person bathing (including washing, rinsing, drying)?: A Little Help from another person to put on and taking off regular upper body clothing?: A Lot Help from another person to put on and taking off regular lower body clothing?: A Little 6 Click Score: 19    End of Session Equipment Utilized During Treatment: Other (comment) (sling)  OT Visit Diagnosis: Pain Pain - Right/Left: Left Pain - part of body: Shoulder   Activity Tolerance Patient tolerated treatment well   Patient Left in chair;with call bell/phone within reach;with family/visitor present   Nurse Communication Other (comment) (OT complete)        Time: 8115-7262 OT Time Calculation (min): 39 min  Charges: OT General Charges $OT Visit: 1 Visit OT Treatments $Self Care/Home Management : 38-52 mins  Delbert Phenix OT OT pager: [place] 12/22/2020, 10:59 AM

## 2020-12-22 NOTE — Progress Notes (Signed)
Patient discharged to home w/ family. Given all belongings, instructions, equipment. Verbalized understanding of instructions. Escorted to pov via w/c. 

## 2020-12-22 NOTE — TOC Transition Note (Signed)
Transition of Care [facility]) - CM/SW Discharge Note  Patient Details  Name: Deborah Frazier MRN: [id number] Date of Birth: 06/08/1946  [facility] CM/SW Contact:  Sherie Don, LCSW Phone Number: 12/22/2020, 10:50 AM  Clinical Narrative: Patient to discharge home today. Orders placed for HHPT and HHOT. CSW met with patient and husband to get choice. Patient did not have a preference for the [facility] agency provided it covers [place]. CSW made referral to [place] with Alvis Lemmings. CSW updated husband of referral. TOC signing off.  Final next level of care: [place] Barriers to Discharge: No Barriers Identified  Patient Goals and CMS Choice Patient states their goals for this hospitalization and ongoing recovery are:: Discharge home with [facility] CMS Medicare.gov Compare Post Acute Care list provided to:: Patient Choice offered to / list presented to : Patient  Discharge Plan and Services      DME Arranged: N/A DME Agency: NA HH Arranged: OT,PT [place]: [place] Date [place]: 12/22/20 Time [place]: 7972 Representative spoke with at [place]: Georgina Snell  Readmission Risk Interventions No flowsheet data found.

## 2020-12-26 NOTE — Discharge Summary (Signed)
PATIENT ID:      Deborah Frazier  MRN:     [id number] DOB/AGE:    [birth year] / 75 y.o.     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 Anemia   . Arthritis   . Cancer (HCC)    skin,breast  . Depression   . Dysrhythmia   . Hypertension   . Hypothyroidism   . Pre-diabetes     DISCHARGE DIAGNOSIS:   Active Problems:   S/P reverse total shoulder arthroplasty, left   PROCEDURE: Procedure(s): REVERSE SHOULDER ARTHROPLASTY on 12/21/2020  CONSULTS:    HISTORY:  See H&P in chart.  HOSPITAL COURSE:  Deborah Frazier is a 75 y.o. 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  They were brought to the operating room on 12/21/2020 and underwent Procedure(s): [place].    They were given perioperative antibiotics:  Anti-infectives (From admission, onward)   Start     Dose/Rate Route Frequency Ordered Stop   12/21/20 1528  vancomycin (VANCOCIN) powder  Status:  Discontinued          As needed 12/21/20 1528 12/21/20 1810   12/21/20 1216  ceFAZolin (ANCEF) 2-4 GM/100ML-% IVPB       Note to Pharmacy: Deborah Frazier   : cabinet override      12/21/20 1216 12/21/20 1451   12/21/20 1215  ceFAZolin (ANCEF) IVPB 2g/100 mL premix        2 g 200 mL/hr over 30 Minutes Intravenous On call to O.R. 12/21/20 1207 12/21/20 1435    .  Patient underwent the above named procedure and tolerated it well. The following day they were hemodynamically stable and pain was controlled on oral analgesics. They were neurovascularly intact to the operative extremity. OT was ordered and worked with patient per protocol. They were medically and orthopaedically stable for discharge on 12/22/2020.    DIAGNOSTIC  STUDIES:  RECENT RADIOGRAPHIC STUDIES :  No results found.  RECENT VITAL SIGNS:  No data found.Marland Kitchen  RECENT EKG RESULTS:    Orders placed or performed during the hospital encounter of 12/18/20  . EKG 12 lead per protocol  . EKG 12 lead per protocol    DISCHARGE INSTRUCTIONS:    DISCHARGE MEDICATIONS:   Allergies as of 12/22/2020      Reactions   Remicade [infliximab] Rash      Medication List    TAKE these medications   acetaminophen 650 MG CR tablet Commonly known as: TYLENOL Take 650-1,300 mg by mouth every 8 (eight) hours as needed for pain.   apixaban 5 MG Tabs tablet Commonly known as: ELIQUIS Take 5 mg by mouth 2 (two) times daily.   atorvastatin 40 MG tablet Commonly known as: LIPITOR Take 40 mg by mouth daily.   CALCIUM 600 + D PO Take 1 tablet by mouth daily.   CENTRUM SILVER PO Take by mouth daily.   cyclobenzaprine 10 MG tablet Commonly known as: FLEXERIL Take 1 tablet (10 mg total) by mouth 3 (three) times daily as needed for muscle spasms.   furosemide 20 MG tablet Commonly known as: LASIX Take 20 mg by mouth daily as needed for edema.   golimumab 50 MG/4ML Soln injection Commonly known as: SIMPONI ARIA Inject 50 mg into the vein every 8 (eight) weeks.   leflunomide 20  MG tablet Commonly known as: ARAVA Take 20 mg by mouth daily.   levothyroxine 25 MCG tablet Commonly known as: SYNTHROID Take 25 mcg by mouth daily.   loratadine 10 MG tablet Commonly known as: CLARITIN Take 10 mg by mouth daily as needed for allergies.   losartan 50 MG tablet Commonly known as: COZAAR Take 50 mg by mouth daily.   METAMUCIL FIBER PO Take 1 capsule by mouth daily as needed (constipation).   metoprolol succinate 25 MG 24 hr tablet Commonly known as: TOPROL-XL Take 25 mg by mouth daily.   ondansetron 4 MG tablet Commonly known as: Zofran Take 1 tablet (4 mg total) by mouth every 8 (eight) hours as needed for nausea or vomiting.    oxyCODONE-acetaminophen 5-325 MG tablet Commonly known as: Percocet Take 1 tablet by mouth every 4 (four) hours as needed (max 6 q).   PARoxetine 30 MG tablet Commonly known as: PAXIL Take 30 mg by mouth every morning.   traMADol 50 MG tablet Commonly known as: Ultram Take 1 tablet (50 mg total) by mouth every 6 (six) hours as needed (prn mild to moderate pain).   trolamine salicylate 10 % cream Commonly known as: ASPERCREME Apply 1 application topically as needed for muscle pain.   vitamin B-12 1000 MCG tablet Commonly known as: CYANOCOBALAMIN Take 1,000 mcg by mouth daily.       FOLLOW UP VISIT:    Follow-up Information    Justice Britain, MD.   Specialty: Orthopedic Surgery Why: on 5/11 at 8:00 Contact information: [address] [place] [address] [phone]        Care, [facility] Follow up.   Specialty: [place] Why: PT and OT Contact information: [place] [address] [phone]               DISCHARGE TO: Home   DISCHARGE CONDITION:  Thereasa Parkin Deborah Frazier for Dr. Justice Britain 12/26/2020, 6:50 PM

## 2021-01-16 ENCOUNTER — Telehealth: Payer: Self-pay

## 2021-01-16 NOTE — Telephone Encounter (Signed)
I spoke with pt. She states the red area is just on one ear. "The rippled looking area is on both ears. Is that something Dr Hinton Rao or Marylu Lund, would look at or a dermatologist"?  I encouraged the pt to reach out to her PCP for evaluation. The PCP can then navigate with whom she will need to f/u with. Dr Hinton Rao ad Marylu Lund, specialize in oncology/hematology diagnoses.  Pt verbalized understanding and appreciated call back

## 2021-03-26 ENCOUNTER — Ambulatory Visit: Payer: Medicare Other | Admitting: Hematology and Oncology

## 2021-03-26 ENCOUNTER — Other Ambulatory Visit: Payer: Medicare Other

## 2021-03-28 ENCOUNTER — Encounter: Payer: Self-pay | Admitting: Hematology and Oncology

## 2021-03-28 ENCOUNTER — Other Ambulatory Visit: Payer: Self-pay

## 2021-03-28 ENCOUNTER — Inpatient Hospital Stay: Payer: Medicare Other | Attending: Hematology and Oncology

## 2021-03-28 ENCOUNTER — Telehealth: Payer: Self-pay | Admitting: Hematology and Oncology

## 2021-03-28 ENCOUNTER — Other Ambulatory Visit: Payer: Self-pay | Admitting: Hematology and Oncology

## 2021-03-28 ENCOUNTER — Inpatient Hospital Stay (INDEPENDENT_AMBULATORY_CARE_PROVIDER_SITE_OTHER): Payer: Medicare Other | Admitting: Hematology and Oncology

## 2021-03-28 VITALS — BP 120/63 | HR 58 | Temp 98.1°F | Resp 18 | Ht 63.0 in | Wt 159.0 lb

## 2021-03-28 DIAGNOSIS — Z853 Personal history of malignant neoplasm of breast: Secondary | ICD-10-CM

## 2021-03-28 DIAGNOSIS — Z1231 Encounter for screening mammogram for malignant neoplasm of breast: Secondary | ICD-10-CM

## 2021-03-28 LAB — HEPATIC FUNCTION PANEL
ALT: 24 (ref 7–35)
AST: 38 — AB (ref 13–35)
Alkaline Phosphatase: 94 (ref 25–125)
Bilirubin, Total: 0.6

## 2021-03-28 LAB — BASIC METABOLIC PANEL
BUN: 14 (ref 4–21)
CO2: 25 — AB (ref 13–22)
Chloride: 108 (ref 99–108)
Creatinine: 0.6 (ref 0.5–1.1)
Glucose: 98
Potassium: 3.7 (ref 3.4–5.3)
Sodium: 139 (ref 137–147)

## 2021-03-28 LAB — CBC AND DIFFERENTIAL
HCT: 36 (ref 36–46)
Hemoglobin: 12 (ref 12.0–16.0)
Neutrophils Absolute: 1.15
Platelets: 162 (ref 150–399)
WBC: 4.1

## 2021-03-28 LAB — COMPREHENSIVE METABOLIC PANEL
Albumin: 3.7 (ref 3.5–5.0)
Calcium: 8.6 — AB (ref 8.7–10.7)

## 2021-03-28 LAB — CBC: RBC: 3.75 — AB (ref 3.87–5.11)

## 2021-03-28 NOTE — Telephone Encounter (Signed)
Per 8/3 los next appt scheduled and given to patient 

## 2021-03-29 ENCOUNTER — Encounter: Payer: Self-pay | Admitting: Hematology and Oncology

## 2021-03-29 NOTE — Progress Notes (Signed)
[facility]  [address] [place],  [place]  [address] [phone]  Clinic Day:  03/29/2021  Referring physician: Algis Greenhouse, MD   CHIEF COMPLAINT:  CC:  Remote history of stage IIA hormone receptor positive breast cancer  Current Treatment:   observation   HISTORY OF PRESENT ILLNESS:  Deborah Frazier is a 75 y.o. 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  She was treated with lumpectomy.  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  1 of 7 lymph nodes was positive for metastasis.  Estrogen and progesterone receptors were positive and her 2 Neu negative.  She received adjuvant chemotherapy with CMF, followed by radiotherapy to the right breast, as well as letrozole 2.5 mg daily for 5 years.  She has remained without evidence of recurrence.  Due to her personal and family history of cancer, we recommended she be tested for hereditary cancer syndromes.  The patient had the [facility] myRisk Hereditary Cancer Panel test last year and this was negative for any genetic mutations or variants of uncertain significance.  She follows up with her rheumatologist and continues to take Simponi Aria (golimumab) treatment for rheumatoid arthritis.  She is still on Eliquis for her atrial fibrillation.  She also has known osteopenia.  Also, he has had a previous splenectomy and cholecystectomy.  She states she does not know when her last colonoscopy was, but she states she was not advised to have another colonoscopy.  She was also hospitalized in 2019 with a small-bowel obstruction.  This resolved with bowel rest.  Review of her records revealed colonoscopy in October 2012 was negative except for small internal hemorrhoids.  CT abdomen and pelvis in November 2019, at the time of her hospitalization, revealed dilated loops of small bowel and small volume ascites due to  small-bowel obstruction, as well as mild biliary ductal dilatation, felt to be most likely secondary to previous cholecystectomy. Annual bilateral mammograms have remained without evidence of malignancy.  At her visit in July 2021, she had worsening anemia iron studies, B12 and folate were normal.  There was no evidence of hemolysis.   INTERVAL HISTORY:  Deborah Frazier is here today for repeat clinical assessment.  She denies any changes in her breasts.  She continues golimumab for her rheumatoid arthritis.  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  She has done well since her surgery.  She reports soft stools, which she attributes to increasing her atorvastatin.  She denies multiple watery stools. She denies fevers or chills. She denies pain. Her appetite is good. Her weight has decreased 5 pounds over last year . Prior to her visit today she had a bilateral screening mammogram which  I did not reveal any evidence of malignancy.  REVIEW OF SYSTEMS:  Review of Systems  Constitutional:  Negative for appetite change, chills, fatigue, fever and unexpected weight change.  HENT:   Negative for lump/mass, mouth sores and sore throat.   Respiratory:  Negative for cough and shortness of breath.   Cardiovascular:  Negative for chest pain and leg swelling.  Gastrointestinal:  Negative for abdominal pain, constipation, diarrhea, nausea and vomiting.  Endocrine: Negative for hot flashes.  Genitourinary:  Negative for difficulty urinating, dysuria, frequency and hematuria.   Musculoskeletal:  Negative for arthralgias, back pain and myalgias.  Skin:  Negative for rash.  Neurological:  Negative for dizziness  and headaches.  Hematological:  Negative for adenopathy. Does not bruise/bleed easily.  Psychiatric/Behavioral:  Negative for depression and sleep disturbance. The patient is not nervous/anxious.     VITALS:  Blood pressure 120/63, pulse (!) 58, temperature 98.1 F (36.7 C), temperature  source Oral, resp. rate 18, height $RemoveBe'5\' 3"'xVsVaXKOA$  (1.6 m), weight 159 lb (72.1 kg), SpO2 100 %.  Wt Readings from Last 3 Encounters:  03/28/21 159 lb (72.1 kg)  12/21/20 164 lb 0.4 oz (74.4 kg)  12/18/20 164 lb (74.4 kg)    Body mass index is 28.17 kg/m.  Performance status (ECOG): 1 - Symptomatic but completely ambulatory  PHYSICAL EXAM:  Physical Exam Vitals and nursing note reviewed.  Constitutional:      General: She is not in acute distress.    Appearance: Normal appearance.  HENT:     Head: Normocephalic and atraumatic.     Mouth/Throat:     Mouth: Mucous membranes are moist.     Pharynx: Oropharynx is clear. No oropharyngeal exudate or posterior oropharyngeal erythema.  Eyes:     General: No scleral icterus.    Extraocular Movements: Extraocular movements intact.     Conjunctiva/sclera: Conjunctivae normal.     Pupils: Pupils are equal, round, and reactive to light.  Cardiovascular:     Rate and Rhythm: Normal rate and regular rhythm.     Heart sounds: Normal heart sounds. No murmur heard.   No friction rub. No gallop.  Pulmonary:     Effort: Pulmonary effort is normal.     Breath sounds: Normal breath sounds. No wheezing, rhonchi or rales.  Chest:  Breasts:    Right: Normal. No swelling, bleeding, inverted nipple, mass, nipple discharge, skin change, tenderness, axillary adenopathy or supraclavicular adenopathy.     Left: Normal. No swelling, bleeding, inverted nipple, mass, nipple discharge, skin change, tenderness, axillary adenopathy or supraclavicular adenopathy.  Abdominal:     General: There is no distension.     Palpations: Abdomen is soft. There is no hepatomegaly, splenomegaly or mass.     Tenderness: There is no abdominal tenderness.  Musculoskeletal:        General: Normal range of motion.     Right hand: Deformity (severe rheumatoid changes) present.     Left hand: Deformity (severe rheumatoid changes) present.     Cervical back: Normal range of motion and  neck supple. No tenderness.     Right lower leg: No edema.     Left lower leg: No edema.  Lymphadenopathy:     Cervical: No cervical adenopathy.     Upper Body:     Right upper body: No supraclavicular or axillary adenopathy.     Left upper body: No supraclavicular or axillary adenopathy.     Lower Body: No right inguinal adenopathy. No left inguinal adenopathy.  Skin:    General: Skin is warm and dry.     Coloration: Skin is not jaundiced.     Findings: No rash.  Neurological:     Mental Status: She is alert and oriented to person, place, and time.     Cranial Nerves: No cranial nerve deficit.  Psychiatric:        Mood and Affect: Mood normal.        Behavior: Behavior normal.        Thought Content: Thought content normal.    LABS:   CBC Latest Ref Rng & Units 03/28/2021 12/18/2020 01/11/2010  WBC - 4.1 5.2 7.8  Hemoglobin 12.0 - 16.0 12.0  12.0 8.4(L)  Hematocrit 36 - 46 36 37.6 24.8(L)  Platelets 150 - 399 162 209 167   CMP Latest Ref Rng & Units 03/28/2021 12/18/2020 01/10/2010  Glucose 70 - 99 mg/dL - 99 143(H)  BUN 4 - $R'21 14 16 'Cg$ 5(L)  Creatinine 0.5 - 1.1 0.6 0.62 0.45  Sodium 137 - 147 139 142 136  Potassium 3.4 - 5.3 3.7 5.0 4.0  Chloride 99 - 108 108 108 103  CO2 13 - 22 25(A) 26 29  Calcium 8.7 - 10.7 8.6(A) 9.2 8.5  Total Protein 6.0 - 8.3 g/dL - - -  Total Bilirubin 0.3 - 1.2 mg/dL - - -  Alkaline Phos 25 - 125 94 - -  AST 13 - 35 38(A) - -  ALT 7 - 35 24 - -     ANC is 1.15  No results found for: CEA1 / No results found for: CEA1 No results found for: PSA1 No results found for: YKD983 No results found for: JAS505  No results found for: TOTALPROTELP, ALBUMINELP, A1GS, A2GS, BETS, BETA2SER, GAMS, MSPIKE, SPEI No results found for: TIBC, FERRITIN, IRONPCTSAT No results found for: LDH  STUDIES:  No results found.  Exam(s): 0722-0013 MAM/MAM DIGITAL TOMO SCREENING B CLINICAL DATA:  Screening.  EXAM: DIGITAL SCREENING BILATERAL MAMMOGRAM WITH TOMOSYNTHESIS  AND CAD  TECHNIQUE: Bilateral screening digital craniocaudal and mediolateral oblique mammograms were obtained. Bilateral screening digital breast tomosynthesis was performed. The images were evaluated with computer-aided detection.  COMPARISON:  Previous exam(s).  ACR Breast Density Category b: There are scattered areas of fibroglandular density.  FINDINGS: There are no findings suspicious for malignancy.  IMPRESSION: No mammographic evidence of malignancy. A result letter of this screening mammogram will be mailed directly to the patient.  RECOMMENDATION: Screening mammogram in one year. (Code:SM-B-01Y)  BI-RADS CATEGORY  1: Negative.  HISTORY:   Past Medical History:  Diagnosis Date   Anemia    Arthritis    Cancer ([place])    skin,breast   Depression    Dysrhythmia    Hypertension    Hypothyroidism    Pre-diabetes     Past Surgical History:  Procedure Laterality Date   BREAST SURGERY     CANCER   CHOLECYSTECTOMY     GASTRIC RESTRICTION SURGERY     HERNIA REPAIR     JOINT REPLACEMENT     KNEE   REVERSE SHOULDER ARTHROPLASTY Left 12/21/2020   Procedure: REVERSE SHOULDER ARTHROPLASTY;  Surgeon: Justice Britain, MD;  Location: [facility];  Service: Orthopedics;  Laterality: Left;  158min    Family History  Problem Relation Age of Onset   Arthritis Mother    Hypertension Mother    Cancer Father        CANCER    Social History:  reports that she has never smoked. She has never used smokeless tobacco. She reports that she does not drink alcohol and does not use drugs.The patient is alone today.  Allergies:  Allergies  Allergen Reactions   Infliximab Rash    Other reaction(s): rash    Current Medications: Current Outpatient Medications  Medication Sig Dispense Refill   acetaminophen (TYLENOL) 650 MG CR tablet Take 650-1,300 mg by mouth every 8 (eight) hours as needed for pain.     apixaban (ELIQUIS) 5 MG TABS tablet Take 5 mg by mouth 2 (two) times daily.      atorvastatin (LIPITOR) 80 MG tablet Take 80 mg by mouth daily.     Biotin 10 MG TABS  1 tablet     Calcium Carb-Cholecalciferol (CALCIUM 600 + D PO) Take 1 tablet by mouth daily.     cyclobenzaprine (FLEXERIL) 10 MG tablet Take 1 tablet (10 mg total) by mouth 3 (three) times daily as needed for muscle spasms. 30 tablet 1   furosemide (LASIX) 20 MG tablet Take 20 mg by mouth daily as needed for edema.     golimumab (SIMPONI ARIA) 50 MG/4ML SOLN injection Inject 50 mg into the vein every 8 (eight) weeks.     leflunomide (ARAVA) 20 MG tablet Take 1 tablet by mouth daily.     levothyroxine (SYNTHROID) 25 MCG tablet 1 tablet     loratadine (CLARITIN) 10 MG tablet Take 10 mg by mouth daily as needed for allergies.     losartan (COZAAR) 50 MG tablet Take 50 mg by mouth daily.     METAMUCIL FIBER PO Take 1 capsule by mouth daily as needed (constipation).     metoprolol succinate (TOPROL-XL) 50 MG 24 hr tablet 1 tablet     Multiple Vitamin (MULTIVITAMIN ADULT) TABS      ondansetron (ZOFRAN) 4 MG tablet Take 1 tablet (4 mg total) by mouth every 8 (eight) hours as needed for nausea or vomiting. 10 tablet 0   oxyCODONE-acetaminophen (PERCOCET) 5-325 MG tablet Take 1 tablet by mouth every 4 (four) hours as needed (max 6 q). 20 tablet 0   PARoxetine (PAXIL) 30 MG tablet Take 30 mg by mouth every morning.     PAXLOVID TBPK PLEASE SEE ATTACHED FOR DETAILED DIRECTIONS     traMADol (ULTRAM) 50 MG tablet Take 1 tablet (50 mg total) by mouth every 6 (six) hours as needed (prn mild to moderate pain). 20 tablet 0   trolamine salicylate (ASPERCREME) 10 % cream Apply 1 application topically as needed for muscle pain.     vitamin B-12 (CYANOCOBALAMIN) 1000 MCG tablet Take 1,000 mcg by mouth daily.     No current facility-administered medications for this visit.     ASSESSMENT & PLAN:   Assessment:   1. 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  She received adjuvant chemotherapy with CMF, followed by  radiotherapy to the right breast, as well as letrozole 2.5 mg daily for 5 years.  She remains without evidence of disease.  2. Mild anemia, which has improved. This may be multifactorial representing anemia of chronic disease related to her rheumatoid arthritis and can be seen with the golimumab.  3. Rheumatoid arthritis, for which she remains on golimumab.  4. Osteopenia.  She continues to take calcium and vitamin-D.    5. Mild neutropenia with a normal total white blood cell count.  This may be secondary to golimumab as well.  Plan:    I am sure her rheumatologist is following her labs, so we will plan to see her back in 1 year with a CBC, comprehensive metabolic panel and bilateral screening mammogram for repeat clinical assessment. The patient understands the plans discussed today and is in agreement with them.  She knows to contact our office if she develops concerns prior to her next appointment.    Marvia Pickles, PA-C

## 2021-04-30 ENCOUNTER — Other Ambulatory Visit: Payer: Self-pay

## 2021-04-30 ENCOUNTER — Encounter (HOSPITAL_COMMUNITY): Payer: Self-pay

## 2021-04-30 ENCOUNTER — Emergency Department (HOSPITAL_COMMUNITY): Payer: Medicare Other

## 2021-04-30 ENCOUNTER — Emergency Department (HOSPITAL_COMMUNITY)
Admission: EM | Admit: 2021-04-30 | Discharge: 2021-04-30 | Disposition: A | Discharge status: Home / Self Care | Payer: Medicare Other | Attending: Emergency Medicine | Admitting: Emergency Medicine

## 2021-04-30 DIAGNOSIS — Z96612 Presence of left artificial shoulder joint: Secondary | ICD-10-CM | POA: Insufficient documentation

## 2021-04-30 DIAGNOSIS — Z7901 Long term (current) use of anticoagulants: Secondary | ICD-10-CM | POA: Insufficient documentation

## 2021-04-30 DIAGNOSIS — E039 Hypothyroidism, unspecified: Secondary | ICD-10-CM | POA: Diagnosis not present

## 2021-04-30 DIAGNOSIS — I10 Essential (primary) hypertension: Secondary | ICD-10-CM | POA: Insufficient documentation

## 2021-04-30 DIAGNOSIS — Z853 Personal history of malignant neoplasm of breast: Secondary | ICD-10-CM | POA: Diagnosis not present

## 2021-04-30 DIAGNOSIS — Z96659 Presence of unspecified artificial knee joint: Secondary | ICD-10-CM | POA: Insufficient documentation

## 2021-04-30 DIAGNOSIS — M25512 Pain in left shoulder: Secondary | ICD-10-CM | POA: Insufficient documentation

## 2021-04-30 DIAGNOSIS — R200 Anesthesia of skin: Secondary | ICD-10-CM | POA: Insufficient documentation

## 2021-04-30 DIAGNOSIS — Z79899 Other long term (current) drug therapy: Secondary | ICD-10-CM | POA: Diagnosis not present

## 2021-04-30 MED ORDER — ACETAMINOPHEN 500 MG PO TABS
1000.0000 mg | ORAL_TABLET | Freq: Once | ORAL | Status: AC
  Administered 2021-04-30: 1000 mg via ORAL
  Filled 2021-04-30: qty 2

## 2021-04-30 NOTE — ED Triage Notes (Signed)
Pt c/o left shoulder pain and numbness, had surgery on left shoulder 5 months ago.

## 2021-04-30 NOTE — Discharge Instructions (Addendum)
Take Tylenol Motrin as needed for shoulder pain. Follow-up with your primary care doctor or your orthopedic doctor later this week if the pain persist. Return if things change or worsen.

## 2021-04-30 NOTE — ED Provider Notes (Signed)
[facility] DEPT Provider Note   CSN: [id number] Arrival date & time: 04/30/21  1051     History Chief Complaint  Patient presents with   Left Shoulder Pain    Deborah Frazier is a 75 y.o. female.  HPI  Patient presents with left shoulder pain.  This started acutely this morning when she was lifting a bag.  She does have history of total shoulder replacement to the left that was completed 5 months ago.  She feels a numbness and pain in the left shoulder and is worried it got dislocated.  Pain is constant. She has not tried any alleviating factors, moving is an aggravating factor.  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     Family History  Problem Relation Age of Onset   Arthritis Mother    Hypertension Mother    Cancer Father        CANCER    Social History   Tobacco Use   Smoking status: Never   Smokeless tobacco: Never  Vaping Use   Vaping Use: Never used  Substance Use Topics   Alcohol use: No    Drug use: No    Home Medications Prior to Admission medications   Medication Sig Start Date End Date Taking? Authorizing Provider  acetaminophen (TYLENOL) 650 MG CR tablet Take 650-1,300 mg by mouth every 8 (eight) hours as needed for pain.    [provider]  apixaban (ELIQUIS) 5 MG TABS tablet Take 5 mg by mouth 2 (two) times daily. 09/03/16   [provider]  atorvastatin (LIPITOR) 80 MG tablet Take 80 mg by mouth daily. 03/02/21   [provider]  Biotin 10 MG TABS 1 tablet    [provider]  Calcium Carb-Cholecalciferol (CALCIUM 600 + D PO) Take 1 tablet by mouth daily.    [provider]  cyclobenzaprine (FLEXERIL) 10 MG tablet Take 1 tablet (10 mg total) by mouth 3 (three) times daily as needed for muscle spasms. 12/21/20   Shuford, TOlivia Mackie PA-C  furosemide (LASIX) 20 MG tablet Take 20 mg by mouth daily as needed for edema. 05/17/16   [provider]  golimumab (SIMPONI ARIA) 50 MG/4ML SOLN injection Inject 50 mg into the vein every 8 (eight) weeks.    [provider]  leflunomide (ARAVA) 20 MG tablet Take 1 tablet by mouth daily.  [provider]  levothyroxine (SYNTHROID) 25 MCG tablet 1 tablet    [provider]  loratadine (CLARITIN) 10 MG tablet Take 10 mg by mouth daily as needed for allergies.    [provider]  losartan (COZAAR) 50 MG tablet Take 50 mg by mouth daily. 10/30/20   [provider]  METAMUCIL FIBER PO Take 1 capsule by mouth daily as needed (constipation).    [provider]  metoprolol succinate (TOPROL-XL) 50 MG 24 hr tablet 1 tablet    [provider]  Multiple Vitamin (MULTIVITAMIN ADULT) TABS     [provider]  ondansetron (ZOFRAN) 4 MG tablet Take 1 tablet (4 mg total) by mouth every 8 (eight) hours as needed for nausea or vomiting. 12/21/20   Shuford, Olivia Mackie, PA-C  oxyCODONE-acetaminophen (PERCOCET) 5-325 MG tablet Take 1 tablet by mouth  every 4 (four) hours as needed (max 6 q). 12/21/20   Shuford, Olivia Mackie, PA-C  PARoxetine (PAXIL) 30 MG tablet Take 30 mg by mouth every morning.    [provider]  PAXLOVID TBPK PLEASE SEE ATTACHED FOR DETAILED DIRECTIONS 02/18/21   [provider]  traMADol (ULTRAM) 50 MG tablet Take 1 tablet (50 mg total) by mouth every 6 (six) hours as needed (prn mild to moderate pain). 12/22/20 12/22/21  Shuford, Olivia Mackie, PA-C  trolamine salicylate (ASPERCREME) 10 % cream Apply 1 application topically as needed for muscle pain.    [provider]  vitamin B-12 (CYANOCOBALAMIN) 1000 MCG tablet Take 1,000 mcg by mouth daily.    [provider]    Allergies    Infliximab  Review of Systems   Review of Systems  Constitutional:  Negative for fever.  Musculoskeletal:  Positive for arthralgias and myalgias.  Neurological:  Positive for numbness.   Physical Exam Updated Vital Signs BP (!) 163/88 (BP Location: Right Arm)   Pulse 68   Temp 98 F (36.7 C) (Oral)   Resp 18   SpO2 99%   Physical Exam Vitals and nursing note reviewed. Exam conducted with a chaperone present.  Constitutional:      General: She is not in acute distress.    Appearance: Normal appearance.  HENT:     Head: Normocephalic and atraumatic.  Eyes:     General: No scleral icterus.    Extraocular Movements: Extraocular movements intact.     Pupils: Pupils are equal, round, and reactive to light.  Cardiovascular:     Comments: Radial pulse 2+ bilaterally Musculoskeletal:        General: Tenderness present. No deformity.     Comments: Patient has full range of motion at the shoulder, she is able to raise her hand above her head and extend rotate the arm behind her back as well as above her head.  There is no palpable deformity.  Skin:    Capillary Refill: Capillary refill takes less than 2 seconds.     Coloration: Skin is not jaundiced.  Neurological:     Mental Status: She is alert. Mental status  is at baseline.     Coordination: Coordination normal.   ED Results / Procedures / Treatments   Labs (all labs ordered are listed, but only abnormal results are displayed) Labs Reviewed - No data to display  EKG None  Radiology No results found.  Procedures Procedures   Medications Ordered in ED Medications  acetaminophen (TYLENOL) tablet 1,000 mg (has no administration in time range)    ED Course  I have reviewed the  triage vital signs and the nursing notes.  Pertinent labs & imaging results that were available during my care of the patient were reviewed by me and considered in my medical decision making (see chart for details).    MDM Rules/Calculators/A&P                           Patient is neurovascularly intact, her vitals are stable although she is mildly hypertensive.  Will treat pain and get radiograph to assess for possible dislocation at the shoulder.  Overall, physical exam is very reassuring given that patient has full range of motion at the shoulder.  I do not feel any dislocation of the shoulder.  Patient was examined by myself and my attending.  Radiograph is negative for dislocation the patient has full range of motion to the shoulder at this time.  It is possible she could have dislocated the shoulder at home and put it back into place prior to arrival through massage.  Will have patient follow-up as needed outpatient, at this time she is appropriate for discharge home.  Discussed HPI, physical exam and plan of care for this patient with attending Campbell Stall. The attending physician evaluated this patient as part of a shared visit and agrees with plan of care.   Final Clinical Impression(s) / ED Diagnoses Final diagnoses:  None    Rx / DC Orders ED Discharge Orders     None        Sherrill Raring, PA-C [id number]    Gray, [place] P, DO [id number]

## 2021-06-06 NOTE — Progress Notes (Addendum)
PCP - Teressa Lower , MD LOV 5 3 22  care every where Cardiologist- Dr. Jeneen Rinks McGukin LOV 7-19- 29  care every where   PPM/ICD -  Device Orders -  Rep Notified -   Chest x-ray -  EKG - 12-18-20 epic Stress Test -  ECHO -  Cardiac Cath -   Sleep Study -  CPAP -   Fasting Blood Sugar -  Checks Blood Sugar _____ times a day  Blood Thinner Instructions:  Elequis  Last dose 06-10-21  Aspirin Instructions:  ERAS Protcol - PRE-SURGERY  G2-   COVID TEST-  Same day as surgery COVID vaccine -  Activity--No SOB doing ADLs  and working around home . Gets some SOB walking uphill driveway not new for her Anesthesia review: A-fib, HTN, pre DM  Patient denies shortness of breath, fever, cough and chest pain at PAT appointment   All instructions explained to the patient, with a verbal understanding of the material. Patient agrees to go over the instructions while at home for a better understanding. Patient also instructed to self quarantine after being tested for COVID-19. The opportunity to ask questions was provided.

## 2021-06-06 NOTE — Progress Notes (Signed)
Please place orders in epic for preop 

## 2021-06-06 NOTE — Patient Instructions (Addendum)
DUE TO COVID-19 ONLY ONE VISITOR IS ALLOWED TO COME WITH YOU AND STAY IN THE WAITING ROOM ONLY DURING PRE OP AND PROCEDURE DAY OF SURGERY.   Up to two visitors ages 16+ are allowed at one time in a patient's room.  The visitors may rotate out with other people throughout the day.  Additionally, up to two children between the ages of 62 and 9 are allowed and do not count toward the number of allowed visitors.  Children within this age range must be accompanied by an adult visitor.  One adult visitor may remain with the patient overnight and must be in the room by 8 PM.          Your procedure is scheduled on: 06-12-21   Report to [place]  Entrance   Report to admitting at      [place] AM     Call this number if you have problems the morning of surgery [phone]   Remember: NO SOLID FOOD AFTER MIDNIGHT THE NIGHT PRIOR TO SURGERY. NOTHING BY MOUTH EXCEPT CLEAR LIQUIDS UNTIL   0845 am .   PLEASE FINISH  G2 DRINK PER SURGEON ORDER  WHICH NEEDS TO BE COMPLETED AT       0845 am then nothing by mouth.      CLEAR LIQUID DIET                                                                    water Black Coffee and tea, regular and decaf No Creamer                            Plain Jell-O any favor except red or purple                                  Fruit ices (not with fruit pulp)                                      Iced Popsicles                                     Carbonated beverages, regular and diet                                    Cranberry, grape and apple juices Sports drinks like Gatorade Lightly seasoned clear broth or consume(fat free) Sugar, honey syrup  _     BRUSH YOUR TEETH MORNING OF SURGERY AND RINSE YOUR MOUTH OUT, NO CHEWING GUM CANDY OR MINTS.     Take these medicines the morning of surgery with A SIP OF WATER: Paxil, metoprolol, levothyroxine, leflunomide, atorvastatin   Stop taking __Elequis __________as directed by your  Surgeon/Cardiologist.  Contact your Surgeon/Cardiologist for instructions on Anticoagulant Therapy prior to surgery.  You may not have any metal on your body including hair pins and              piercings  Do not wear jewelry, make-up, lotions, powders,perfumes,   or     deodorant             Do not wear nail polish on your fingernails or toenails .  Do not shave  48 hours prior to surgery.             Do not bring valuables to the hospital. [place].  Contacts, dentures or bridgework may not be worn into surgery.  You may bring a small overnight bag with you     Patients discharged the day of surgery will not be allowed to drive home. IF YOU ARE HAVING SURGERY AND GOING HOME THE SAME DAY, YOU MUST HAVE AN ADULT TO DRIVE YOU HOME AND BE WITH YOU FOR 24 HOURS. YOU MAY GO HOME BY TAXI OR UBER OR ORTHERWISE, BUT AN ADULT MUST ACCOMPANY YOU HOME AND STAY WITH YOU FOR 24 HOURS.  Name and phone number of your driver:  Special Instructions: N/A              Please read over the following fact sheets you were given: _____________________________________________________________________  [facility] Preparing for Total Shoulder Arthroplasty    Before surgery, you can play an important role. Because skin is not sterile, your skin needs to be as free of germs as possible. You can reduce the number of germs on your skin by using the following products. Benzoyl Peroxide Gel Reduces the number of germs present on the skin Applied twice a day to shoulder area starting two days before surgery    ==================================================================  Please follow these instructions carefully:  BENZOYL PEROXIDE 5% GEL  Please do not use if you have an allergy to benzoyl peroxide.   If your skin becomes reddened/irritated stop using the benzoyl peroxide.  Starting two days before surgery, apply as  follows: Apply benzoyl peroxide in the morning and at night. Apply after taking a shower. If you are not taking a shower clean entire shoulder front, back, and side along with the armpit with a clean wet washcloth.  Place a quarter-sized dollop on your shoulder and rub in thoroughly, making sure to cover the front, back, and side of your shoulder, along with the armpit.   2 days before ____ AM   ____ PM              1 day before ____ AM   ____ PM                         Do this twice a day for two days.  (Last application is the night before surgery, AFTER using the CHG soap as described below).  Do NOT apply benzoyl peroxide gel on the day of surgery.            [facility] - Preparing for Surgery Before surgery, you can play an important role.  Because skin is not sterile, your skin needs to be as free of germs as possible.  You can reduce the number of germs on your skin by washing with CHG (chlorahexidine gluconate) soap before surgery.  CHG is an antiseptic cleaner which kills germs and bonds with the skin to  continue killing germs even after washing. Please DO NOT use if you have an allergy to CHG or antibacterial soaps.  If your skin becomes reddened/irritated stop using the CHG and inform your nurse when you arrive at Short Stay. Do not shave (including legs and underarms) for at least 48 hours prior to the first CHG shower.  You may shave your face/neck. Please follow these instructions carefully:  1.  Shower with CHG Soap the night before surgery and the  morning of Surgery.  2.  If you choose to wash your hair, wash your hair first as usual with your  normal  shampoo.  3.  After you shampoo, rinse your hair and body thoroughly to remove the  shampoo.                           4.  Use CHG as you would any other liquid soap.  You can apply chg directly  to the skin and wash                       Gently with a scrungie or clean washcloth.  5.  Apply the CHG Soap to your body ONLY FROM THE  NECK DOWN.   Do not use on face/ open                           Wound or open sores. Avoid contact with eyes, ears mouth and genitals (private parts).                       Wash face,  Genitals (private parts) with your normal soap.             6.  Wash thoroughly, paying special attention to the area where your surgery  will be performed.  7.  Thoroughly rinse your body with warm water from the neck down.  8.  DO NOT shower/wash with your normal soap after using and rinsing off  the CHG Soap.                9.  Pat yourself dry with a clean towel.            10.  Wear clean pajamas.            11.  Place clean sheets on your bed the night of your first shower and do not  sleep with pets. Day of Surgery : Do not apply any lotions/deodorants the morning of surgery.  Please wear clean clothes to the hospital/surgery center.  FAILURE TO FOLLOW THESE INSTRUCTIONS MAY RESULT IN THE CANCELLATION OF YOUR SURGERY PATIENT SIGNATURE_________________________________  NURSE SIGNATURE__________________________________  ________________________________________________________________________    [facility]  An incentive spirometer is a tool that can help keep your lungs clear and active. This tool measures how well you are filling your lungs with each breath. Taking long deep breaths may help reverse or decrease the chance of developing breathing (pulmonary) problems (especially infection) following: A long period of time when you are unable to move or be active. BEFORE THE PROCEDURE  If the spirometer includes an indicator to show your best effort, your nurse or respiratory therapist will set it to a desired goal. If possible, sit up straight or lean slightly forward. Try not to slouch. Hold the incentive spirometer in an upright position. INSTRUCTIONS FOR USE  Sit on the edge of  your bed if possible, or sit up as far as you can in bed or on a chair. Hold the incentive spirometer in an  upright position. Breathe out normally. Place the mouthpiece in your mouth and seal your lips tightly around it. Breathe in slowly and as deeply as possible, raising the piston or the ball toward the top of the column. Hold your breath for 3-5 seconds or for as long as possible. Allow the piston or ball to fall to the bottom of the column. Remove the mouthpiece from your mouth and breathe out normally. Rest for a few seconds and repeat Steps 1 through 7 at least 10 times every 1-2 hours when you are awake. Take your time and take a few normal breaths between deep breaths. The spirometer may include an indicator to show your best effort. Use the indicator as a goal to work toward during each repetition. After each set of 10 deep breaths, practice coughing to be sure your lungs are clear. If you have an incision (the cut made at the time of surgery), support your incision when coughing by placing a pillow or rolled up towels firmly against it. Once you are able to get out of bed, walk around indoors and cough well. You may stop using the incentive spirometer when instructed by your caregiver.  RISKS AND COMPLICATIONS Take your time so you do not get dizzy or light-headed. If you are in pain, you may need to take or ask for pain medication before doing incentive spirometry. It is harder to take a deep breath if you are having pain. AFTER USE Rest and breathe slowly and easily. It can be helpful to keep track of a log of your progress. Your caregiver can provide you with a simple table to help with this. If you are using the spirometer at home, follow these instructions: [place] IF:  You are having difficultly using the spirometer. You have trouble using the spirometer as often as instructed. Your pain medication is not giving enough relief while using the spirometer. You develop fever of 100.5 F (38.1 C) or higher. SEEK IMMEDIATE MEDICAL CARE IF:  You cough up bloody sputum that had  not been present before. You develop fever of 102 F (38.9 C) or greater. You develop worsening pain at or near the incision site. MAKE SURE YOU:  Understand these instructions. Will watch your condition. Will get help right away if you are not doing well or get worse. Document Released: 12/23/2006 Document Revised: 11/04/2011 Document Reviewed: 02/23/2007 [facility] Patient Information 2014 [place], [place].   ________________________________________________________________________

## 2021-06-07 ENCOUNTER — Other Ambulatory Visit (HOSPITAL_COMMUNITY): Payer: Self-pay

## 2021-06-11 ENCOUNTER — Encounter (HOSPITAL_COMMUNITY): Payer: Self-pay

## 2021-06-11 ENCOUNTER — Other Ambulatory Visit: Payer: Self-pay

## 2021-06-11 ENCOUNTER — Other Ambulatory Visit (HOSPITAL_COMMUNITY): Payer: Self-pay

## 2021-06-11 ENCOUNTER — Encounter (HOSPITAL_COMMUNITY)
Admission: RE | Admit: 2021-06-11 | Discharge: 2021-06-11 | Disposition: A | Discharge status: Home / Self Care | Payer: Medicare Other | Source: Ambulatory Visit | Attending: Orthopedic Surgery | Admitting: Orthopedic Surgery

## 2021-06-11 DIAGNOSIS — Z01812 Encounter for preprocedural laboratory examination: Secondary | ICD-10-CM | POA: Diagnosis present

## 2021-06-11 LAB — BASIC METABOLIC PANEL
Anion gap: 5 (ref 5–15)
BUN: 16 mg/dL (ref 8–23)
CO2: 27 mmol/L (ref 22–32)
Calcium: 8.7 mg/dL — ABNORMAL LOW (ref 8.9–10.3)
Chloride: 107 mmol/L (ref 98–111)
Creatinine, Ser: 0.47 mg/dL (ref 0.44–1.00)
GFR, Estimated: >60 mL/min (ref >60)
Glucose, Bld: 95 mg/dL (ref 70–99)
Potassium: 4.2 mmol/L (ref 3.5–5.1)
Sodium: 139 mmol/L (ref 135–145)

## 2021-06-11 LAB — CBC
HCT: 35 % — ABNORMAL LOW (ref 36.0–46.0)
Hemoglobin: 11.2 g/dL — ABNORMAL LOW (ref 12.0–15.0)
MCH: 32.1 pg (ref 26.0–34.0)
MCHC: 32 g/dL (ref 30.0–36.0)
MCV: 100.3 fL — ABNORMAL HIGH (ref 80.0–100.0)
Platelets: 217 10*3/uL (ref 150–400)
RBC: 3.49 MIL/uL — ABNORMAL LOW (ref 3.87–5.11)
RDW: 15.9 % — ABNORMAL HIGH (ref 11.5–15.5)
WBC: 4.5 10*3/uL (ref 4.0–10.5)
nRBC: 0 % (ref 0.0–0.2)

## 2021-06-11 LAB — HEMOGLOBIN A1C
Hgb A1c MFr Bld: 5.6 % (ref 4.8–5.6)
Mean Plasma Glucose: 114.02 mg/dL

## 2021-06-11 LAB — GLUCOSE, CAPILLARY: Glucose-Capillary: 88 mg/dL (ref 70–99)

## 2021-06-11 LAB — SURGICAL PCR SCREEN
MRSA, PCR: NEGATIVE
Staphylococcus aureus: POSITIVE — AB

## 2021-06-12 ENCOUNTER — Other Ambulatory Visit (HOSPITAL_COMMUNITY): Payer: Self-pay

## 2021-06-12 ENCOUNTER — Encounter (HOSPITAL_COMMUNITY)
Admission: RE | Disposition: A | Discharge status: Home / Self Care | Payer: Self-pay | Source: Home / Self Care | Attending: Orthopedic Surgery

## 2021-06-12 ENCOUNTER — Encounter (HOSPITAL_COMMUNITY): Payer: Self-pay | Admitting: Orthopedic Surgery

## 2021-06-12 ENCOUNTER — Ambulatory Visit (HOSPITAL_COMMUNITY)
Admission: RE | Admit: 2021-06-12 | Discharge: 2021-06-12 | Disposition: A | Discharge status: Home / Self Care | Payer: Medicare Other | Attending: Orthopedic Surgery | Admitting: Orthopedic Surgery

## 2021-06-12 ENCOUNTER — Encounter (HOSPITAL_COMMUNITY): Payer: Self-pay

## 2021-06-12 ENCOUNTER — Inpatient Hospital Stay (HOSPITAL_COMMUNITY): Payer: Medicare Other | Admitting: Certified Registered Nurse Anesthetist

## 2021-06-12 DIAGNOSIS — Z96612 Presence of left artificial shoulder joint: Secondary | ICD-10-CM

## 2021-06-12 DIAGNOSIS — Z7901 Long term (current) use of anticoagulants: Secondary | ICD-10-CM | POA: Diagnosis not present

## 2021-06-12 DIAGNOSIS — T8489XA Other specified complication of internal orthopedic prosthetic devices, implants and grafts, initial encounter: Principal | ICD-10-CM | POA: Insufficient documentation

## 2021-06-12 DIAGNOSIS — I4891 Unspecified atrial fibrillation: Secondary | ICD-10-CM | POA: Insufficient documentation

## 2021-06-12 DIAGNOSIS — E039 Hypothyroidism, unspecified: Secondary | ICD-10-CM | POA: Insufficient documentation

## 2021-06-12 DIAGNOSIS — Z79890 Hormone replacement therapy (postmenopausal): Secondary | ICD-10-CM | POA: Diagnosis not present

## 2021-06-12 DIAGNOSIS — I10 Essential (primary) hypertension: Secondary | ICD-10-CM | POA: Diagnosis not present

## 2021-06-12 DIAGNOSIS — X58XXXA Exposure to other specified factors, initial encounter: Secondary | ICD-10-CM | POA: Diagnosis not present

## 2021-06-12 DIAGNOSIS — Z20822 Contact with and (suspected) exposure to covid-19: Secondary | ICD-10-CM | POA: Diagnosis not present

## 2021-06-12 DIAGNOSIS — Z79899 Other long term (current) drug therapy: Secondary | ICD-10-CM | POA: Diagnosis not present

## 2021-06-12 HISTORY — PX: TOTAL SHOULDER REVISION: SHX6130

## 2021-06-12 LAB — GLUCOSE, CAPILLARY
Glucose-Capillary: 73 mg/dL (ref 70–99)
Glucose-Capillary: 102 mg/dL — ABNORMAL HIGH (ref 70–99)

## 2021-06-12 LAB — TYPE AND SCREEN
ABO/RH(D): A POS
Antibody Screen: NEGATIVE

## 2021-06-12 LAB — SARS CORONAVIRUS 2 BY RT PCR (HOSPITAL ORDER, PERFORMED IN ~~LOC~~ HOSPITAL LAB): SARS Coronavirus 2: NEGATIVE

## 2021-06-12 SURGERY — REVISION, TOTAL ARTHROPLASTY, SHOULDER
Anesthesia: General | Site: Shoulder | Laterality: Left

## 2021-06-12 MED ORDER — STERILE WATER FOR IRRIGATION IR SOLN
Status: DC | PRN
  Administered 2021-06-12: 6000 mL

## 2021-06-12 MED ORDER — PHENYLEPHRINE HCL-NACL 20-0.9 MG/250ML-% IV SOLN
INTRAVENOUS | Status: DC | PRN
Start: 2021-06-12 — End: 2021-06-12
  Administered 2021-06-12: 25 ug/min via INTRAVENOUS

## 2021-06-12 MED ORDER — PROPOFOL 10 MG/ML IV BOLUS
INTRAVENOUS | Status: DC | PRN
  Administered 2021-06-12: 30 mg via INTRAVENOUS
  Administered 2021-06-12: 100 mg via INTRAVENOUS
  Administered 2021-06-12: 30 mg via INTRAVENOUS

## 2021-06-12 MED ORDER — TRANEXAMIC ACID-NACL 1000-0.7 MG/100ML-% IV SOLN
1000.0000 mg | INTRAVENOUS | Status: AC
  Administered 2021-06-12: 1000 mg via INTRAVENOUS
  Filled 2021-06-12: qty 100

## 2021-06-12 MED ORDER — PHENYLEPHRINE HCL (PRESSORS) 10 MG/ML IV SOLN
INTRAVENOUS | Status: AC
  Filled 2021-06-12: qty 2

## 2021-06-12 MED ORDER — SUGAMMADEX SODIUM 200 MG/2ML IV SOLN
INTRAVENOUS | Status: DC | PRN
  Administered 2021-06-12: 150 mg via INTRAVENOUS

## 2021-06-12 MED ORDER — TRAMADOL HCL 50 MG PO TABS
50.0000 mg | ORAL_TABLET | Freq: Four times a day (QID) | ORAL | 0 refills | Status: DC | PRN
  Filled 2021-06-12: qty 20, 5d supply, fill #0

## 2021-06-12 MED ORDER — ROCURONIUM BROMIDE 10 MG/ML (PF) SYRINGE
PREFILLED_SYRINGE | INTRAVENOUS | Status: AC
  Filled 2021-06-12: qty 10

## 2021-06-12 MED ORDER — BUPIVACAINE-EPINEPHRINE (PF) 0.5% -1:200000 IJ SOLN
INTRAMUSCULAR | Status: DC | PRN
Start: 2021-06-12 — End: 2021-06-12
  Administered 2021-06-12: 15 mL via PERINEURAL

## 2021-06-12 MED ORDER — CEFAZOLIN SODIUM-DEXTROSE 2-4 GM/100ML-% IV SOLN
2.0000 g | INTRAVENOUS | Status: AC
  Administered 2021-06-12: 2 g via INTRAVENOUS
  Filled 2021-06-12: qty 100

## 2021-06-12 MED ORDER — FENTANYL CITRATE PF 50 MCG/ML IJ SOSY: 25.0000 ug | PREFILLED_SYRINGE | INTRAMUSCULAR | Status: DC | PRN

## 2021-06-12 MED ORDER — ONDANSETRON HCL 4 MG/2ML IJ SOLN
INTRAMUSCULAR | Status: DC | PRN
  Administered 2021-06-12: 4 mg via INTRAVENOUS

## 2021-06-12 MED ORDER — ROCURONIUM BROMIDE 10 MG/ML (PF) SYRINGE
PREFILLED_SYRINGE | INTRAVENOUS | Status: DC | PRN
  Administered 2021-06-12: 40 mg via INTRAVENOUS

## 2021-06-12 MED ORDER — BUPIVACAINE LIPOSOME 1.3 % IJ SUSP
INTRAMUSCULAR | Status: DC | PRN
  Administered 2021-06-12: 10 mL via PERINEURAL

## 2021-06-12 MED ORDER — MIDAZOLAM HCL 2 MG/2ML IJ SOLN
1.0000 mg | INTRAMUSCULAR | Status: DC
Start: 2021-06-12 — End: 2021-06-12
  Filled 2021-06-12: qty 2

## 2021-06-12 MED ORDER — PHENYLEPHRINE 40 MCG/ML (10ML) SYRINGE FOR IV PUSH (FOR BLOOD PRESSURE SUPPORT)
PREFILLED_SYRINGE | INTRAVENOUS | Status: DC | PRN
  Administered 2021-06-12 (×3): 80 ug via INTRAVENOUS

## 2021-06-12 MED ORDER — FENTANYL CITRATE (PF) 100 MCG/2ML IJ SOLN
INTRAMUSCULAR | Status: AC
  Filled 2021-06-12: qty 2

## 2021-06-12 MED ORDER — VANCOMYCIN HCL 1000 MG IV SOLR
INTRAVENOUS | Status: AC
  Filled 2021-06-12: qty 20

## 2021-06-12 MED ORDER — DEXAMETHASONE SODIUM PHOSPHATE 10 MG/ML IJ SOLN
INTRAMUSCULAR | Status: AC
  Filled 2021-06-12: qty 1

## 2021-06-12 MED ORDER — VANCOMYCIN HCL 1000 MG IV SOLR
INTRAVENOUS | Status: DC | PRN
  Administered 2021-06-12: 1000 mg

## 2021-06-12 MED ORDER — FENTANYL CITRATE PF 50 MCG/ML IJ SOSY
50.0000 ug | PREFILLED_SYRINGE | INTRAMUSCULAR | Status: DC
  Administered 2021-06-12: 50 ug via INTRAVENOUS
  Filled 2021-06-12: qty 2

## 2021-06-12 MED ORDER — LACTATED RINGERS IV SOLN: INTRAVENOUS | Status: DC

## 2021-06-12 MED ORDER — LIDOCAINE HCL (PF) 2 % IJ SOLN
INTRAMUSCULAR | Status: AC
  Filled 2021-06-12: qty 5

## 2021-06-12 MED ORDER — SODIUM CHLORIDE 0.9 % IR SOLN
Status: DC | PRN
  Administered 2021-06-12: 1000 mL

## 2021-06-12 MED ORDER — ONDANSETRON HCL 4 MG/2ML IJ SOLN
INTRAMUSCULAR | Status: AC
  Filled 2021-06-12: qty 2

## 2021-06-12 MED ORDER — LIDOCAINE HCL (CARDIAC) PF 100 MG/5ML IV SOSY
PREFILLED_SYRINGE | INTRAVENOUS | Status: DC | PRN
  Administered 2021-06-12: 25 mg via INTRAVENOUS

## 2021-06-12 MED ORDER — ACETAMINOPHEN 500 MG PO TABS: 1000.0000 mg | ORAL_TABLET | Freq: Once | ORAL | Status: DC

## 2021-06-12 MED ORDER — OXYCODONE-ACETAMINOPHEN 5-325 MG PO TABS
1.0000 | ORAL_TABLET | ORAL | 0 refills | Status: DC | PRN
  Filled 2021-06-12: qty 20, 4d supply, fill #0

## 2021-06-12 MED ORDER — PHENYLEPHRINE 40 MCG/ML (10ML) SYRINGE FOR IV PUSH (FOR BLOOD PRESSURE SUPPORT)
PREFILLED_SYRINGE | INTRAVENOUS | Status: AC
  Filled 2021-06-12: qty 10

## 2021-06-12 MED ORDER — DEXAMETHASONE SODIUM PHOSPHATE 4 MG/ML IJ SOLN
INTRAMUSCULAR | Status: DC | PRN
  Administered 2021-06-12: 5 mg via INTRAVENOUS

## 2021-06-12 SURGICAL SUPPLY — 70 items
ADH SKN CLS APL DERMABOND .7 (GAUZE/BANDAGES/DRESSINGS) ×1
AID PSTN UNV HD RSTRNT DISP (MISCELLANEOUS) ×1
BAG COUNTER SPONGE SURGICOUNT (BAG) IMPLANT
BAG SPEC THK2 15X12 ZIP CLS (MISCELLANEOUS) ×1
BAG SPNG CNTER NS LX DISP (BAG)
BAG ZIPLOCK 12X15 (MISCELLANEOUS) ×2 IMPLANT
BLADE SAW SGTL 83.5X18.5 (BLADE) IMPLANT
COOLER ICEMAN CLASSIC (MISCELLANEOUS) IMPLANT
COVER BACK TABLE 60X90IN (DRAPES) ×2 IMPLANT
COVER SURGICAL LIGHT HANDLE (MISCELLANEOUS) ×2 IMPLANT
DERMABOND ADVANCED (GAUZE/BANDAGES/DRESSINGS) ×1
DERMABOND ADVANCED .7 DNX12 (GAUZE/BANDAGES/DRESSINGS) ×1 IMPLANT
DRAPE INCISE IOBAN 66X45 STRL (DRAPES) IMPLANT
DRAPE ORTHO SPLIT 77X108 STRL (DRAPES) ×4
DRAPE SHEET LG 3/4 BI-LAMINATE (DRAPES) ×2 IMPLANT
DRAPE SURG 17X11 SM STRL (DRAPES) ×2 IMPLANT
DRAPE SURG ORHT 6 SPLT 77X108 (DRAPES) ×2 IMPLANT
DRAPE U-SHAPE 47X51 STRL (DRAPES) ×2 IMPLANT
DRESSING PEEL AND PLC PRVNA 13 (GAUZE/BANDAGES/DRESSINGS) IMPLANT
DRSG AQUACEL AG ADV 3.5X10 (GAUZE/BANDAGES/DRESSINGS) ×2 IMPLANT
DRSG PEEL AND PLACE PREVENA 13 (GAUZE/BANDAGES/DRESSINGS) ×2
DURAPREP 26ML APPLICATOR (WOUND CARE) ×2 IMPLANT
ELECT BLADE TIP CTD 4 INCH (ELECTRODE) ×2 IMPLANT
ELECT REM PT RETURN 15FT ADLT (MISCELLANEOUS) ×2 IMPLANT
FACESHIELD WRAPAROUND (MASK) ×8 IMPLANT
FACESHIELD WRAPAROUND OR TEAM (MASK) ×3 IMPLANT
GLOVE SURG ENC MOIS LTX SZ7.5 (GLOVE) ×2 IMPLANT
GLOVE SURG ENC MOIS LTX SZ8 (GLOVE) ×2 IMPLANT
GLOVE SURG MICRO LTX SZ7 (GLOVE) ×2 IMPLANT
GLOVE SURG MICRO LTX SZ7.5 (GLOVE) ×2 IMPLANT
GOWN STRL REUS W/TWL LRG LVL3 (GOWN DISPOSABLE) ×4 IMPLANT
INSERT HUMERAL UNI REVERS 36 3 (Insert) ×1 IMPLANT
KIT BASIN OR (CUSTOM PROCEDURE TRAY) ×2 IMPLANT
KIT DRSG PREVENA PLUS 7DAY 125 (MISCELLANEOUS) ×1 IMPLANT
MANIFOLD NEPTUNE II (INSTRUMENTS) ×2 IMPLANT
NDL TAPERED W/ NITINOL LOOP (MISCELLANEOUS) IMPLANT
NEEDLE TAPERED W/ NITINOL LOOP (MISCELLANEOUS) IMPLANT
NS IRRIG 1000ML POUR BTL (IV SOLUTION) ×2 IMPLANT
OSTEOTOME THIN 6.0 1.5 (INSTRUMENTS) ×1 IMPLANT
OSTEOTOME THIN 6.0 3 (INSTRUMENTS) ×1 IMPLANT
PACK SHOULDER (CUSTOM PROCEDURE TRAY) ×2 IMPLANT
PAD ARMBOARD 7.5X6 YLW CONV (MISCELLANEOUS) ×4 IMPLANT
PAD COLD SHLDR WRAP-ON (PAD) IMPLANT
PROTECTOR NERVE ULNAR (MISCELLANEOUS) ×2 IMPLANT
RESTRAINT HEAD UNIVERSAL NS (MISCELLANEOUS) ×2 IMPLANT
SLING ARM FOAM STRAP LRG (SOFTGOODS) IMPLANT
SLING ARM FOAM STRAP MED (SOFTGOODS) ×1 IMPLANT
SMARTMIX MINI TOWER (MISCELLANEOUS)
SPONGE T-LAP 18X18 ~~LOC~~+RFID (SPONGE) ×1 IMPLANT
STEM HUMERAL UNI REVERSE SZ10 (Stem) ×1 IMPLANT
SUCTION FRAZIER HANDLE 12FR (TUBING) ×2
SUCTION TUBE FRAZIER 12FR DISP (TUBING) ×1 IMPLANT
SUT ETHILON 2 0 PS N (SUTURE) ×2 IMPLANT
SUT FIBERTAPE CERCLAGE 2 48 (SUTURE) ×1 IMPLANT
SUT MNCRL AB 3-0 PS2 18 (SUTURE) ×2 IMPLANT
SUT MON AB 2-0 CT1 36 (SUTURE) ×2 IMPLANT
SUT VIC AB 1 CT1 36 (SUTURE) ×2 IMPLANT
SUT VIC AB 2-0 CT1 27 (SUTURE) ×2
SUT VIC AB 2-0 CT1 TAPERPNT 27 (SUTURE) ×1 IMPLANT
SUTURE TAPE 1.3 40 TPR END (SUTURE) IMPLANT
SUTURE TAPE TIGERLINK 1.3MM BL (SUTURE) IMPLANT
SUTURETAPE 1.3 40 TPR END (SUTURE)
SUTURETAPE TIGERLINK 1.3MM BL (SUTURE) ×2
SWAB COLLECTION DEVICE MRSA (MISCELLANEOUS) IMPLANT
SWAB CULTURE ESWAB REG 1ML (MISCELLANEOUS) IMPLANT
TOWEL OR 17X26 10 PK STRL BLUE (TOWEL DISPOSABLE) ×2 IMPLANT
TOWEL OR NON WOVEN STRL DISP B (DISPOSABLE) ×2 IMPLANT
TOWER SMARTMIX MINI (MISCELLANEOUS) ×1 IMPLANT
WATER STERILE IRR 1000ML POUR (IV SOLUTION) ×2 IMPLANT
YANKAUER SUCT BULB TIP 10FT TU (MISCELLANEOUS) ×2 IMPLANT

## 2021-06-12 NOTE — Anesthesia Preprocedure Evaluation (Signed)
Anesthesia Evaluation  Patient identified by MRN, date of birth, ID band Patient awake    Reviewed: Allergy & Precautions, H&P , NPO status , Patient's Chart, lab work & pertinent test results, reviewed documented beta blocker date and time   Airway Mallampati: II  TM Distance: >3 FB Neck ROM: Full    Dental no notable dental hx. (+) Teeth Intact, Dental Advisory Given   Pulmonary neg pulmonary ROS,    Pulmonary exam normal breath sounds clear to auscultation       Cardiovascular hypertension, Pt. on medications and Pt. on home beta blockers + dysrhythmias Atrial Fibrillation  Rhythm:Regular Rate:Normal     Neuro/Psych Depression negative neurological ROS     GI/Hepatic negative GI ROS, Neg liver ROS,   Endo/Other  Hypothyroidism   Renal/GU negative Renal ROS  negative genitourinary   Musculoskeletal  (+) Arthritis , Rheumatoid disorders,    Abdominal   Peds  Hematology  (+) Blood dyscrasia, anemia ,   Anesthesia Other Findings   Reproductive/Obstetrics negative OB ROS                             Anesthesia Physical Anesthesia Plan  ASA: 3  Anesthesia Plan: General   Post-op Pain Management:  Regional for Post-op pain   Induction: Intravenous  PONV Risk Score and Plan: 4 or greater and Ondansetron, Dexamethasone and Treatment may vary due to age or medical condition  Airway Management Planned: Oral ETT  Additional Equipment:   Intra-op Plan:   Post-operative Plan: Extubation in OR  Informed Consent: I have reviewed the patients History and Physical, chart, labs and discussed the procedure including the risks, benefits and alternatives for the proposed anesthesia with the patient or authorized representative who has indicated his/her understanding and acceptance.     Dental advisory given  Plan Discussed with: CRNA  Anesthesia Plan Comments:         Anesthesia  Quick Evaluation

## 2021-06-12 NOTE — H&P (Signed)
[facility]    Chief Complaint: unstable left reverse shoulder arthroplasty HPI: The patient is a 75 y.o. female status post a left shoulder reverse arthroplasty back in April of this year for the treatment of chronic left shoulder dislocations.  She initially did well but unfortunately now has had several instability episodes.  Plain radiographs confirm her implant appears to be in good position alignment with no obvious evidence for loosening of the prosthesis.  However due to her recurrent instability she is brought to the operating room for planned revision of her reverse arthroplasty.  Past Medical History:  Diagnosis Date   Anemia    Arthritis    Rheumatoid and osteoarthritis   Cancer ([place])    skin,breast  right   Depression    Dysrhythmia    Afib   Hypertension    Hypothyroidism    Pre-diabetes    denies states she is no longer pre DM    Past Surgical History:  Procedure Laterality Date   BREAST SURGERY     CANCER   CHOLECYSTECTOMY     GASTRIC RESTRICTION SURGERY     HERNIA REPAIR     JOINT REPLACEMENT Right    KNEE   REVERSE SHOULDER ARTHROPLASTY Left 12/21/2020   Procedure: REVERSE SHOULDER ARTHROPLASTY;  Surgeon: Justice Britain, MD;  Location: [facility];  Service: Orthopedics;  Laterality: Left;  114min   TONSILLECTOMY      Family History  Problem Relation Age of Onset   Arthritis Mother    Hypertension Mother    Cancer Father        CANCER    Social History:  reports that she has never smoked. She has never used smokeless tobacco. She reports that she does not drink alcohol and does not use drugs.   Medications Prior to Admission  Medication Sig Dispense Refill   acetaminophen (TYLENOL) 650 MG CR tablet Take 650-1,300 mg by mouth every 8 (eight) hours as needed for pain.     apixaban (ELIQUIS) 5 MG TABS tablet Take 5 mg by mouth 2 (two) times daily.     atorvastatin (LIPITOR) 80 MG tablet Take 80 mg by mouth daily.     Biotin 10 MG TABS Take 10 mg  by mouth every 7 (seven) days.     Calcium Carb-Cholecalciferol (CALCIUM 600 + D PO) Take 1 tablet by mouth daily.     golimumab (SIMPONI ARIA) 50 MG/4ML SOLN injection Inject 50 mg into the vein every 8 (eight) weeks.     leflunomide (ARAVA) 20 MG tablet Take 20 mg by mouth daily.     levothyroxine (SYNTHROID) 25 MCG tablet Take 25 mcg by mouth daily before breakfast.     losartan (COZAAR) 50 MG tablet Take 50 mg by mouth daily.     METAMUCIL FIBER PO Take 1 capsule by mouth daily as needed (constipation).     metoprolol succinate (TOPROL-XL) 25 MG 24 hr tablet Take 25 mg by mouth daily.     Multiple Vitamin (MULTIVITAMIN ADULT) TABS Take 1 tablet by mouth daily.     PARoxetine (PAXIL) 30 MG tablet Take 30 mg by mouth every morning.     triamcinolone ointment (KENALOG) 0.1 % Apply 1 application topically 2 (two) times daily as needed for rash.     vitamin B-12 (CYANOCOBALAMIN) 1000 MCG tablet Take 1,000 mcg by mouth daily.     cyclobenzaprine (FLEXERIL) 10 MG tablet Take 1 tablet (10 mg total) by mouth 3 (three) times daily as needed  for muscle spasms. (Patient not taking: No sig reported) 30 tablet 1   furosemide (LASIX) 20 MG tablet Take 20 mg by mouth daily as needed for edema.     ondansetron (ZOFRAN) 4 MG tablet Take 1 tablet (4 mg total) by mouth every 8 (eight) hours as needed for nausea or vomiting. (Patient not taking: No sig reported) 10 tablet 0   oxyCODONE-acetaminophen (PERCOCET) 5-325 MG tablet Take 1 tablet by mouth every 4 (four) hours as needed (max 6 q). (Patient not taking: No sig reported) 20 tablet 0   traMADol (ULTRAM) 50 MG tablet Take 1 tablet (50 mg total) by mouth every 6 (six) hours as needed (prn mild to moderate pain). (Patient not taking: No sig reported) 20 tablet 0     Physical Exam: Inspection the left shoulder demonstrates moderate diffuse atrophy of the tissues related to her known underlying rheumatoid arthritis.  Her previous anterior shoulder incision is  well-healed.  She is grossly neurovascular intact in left upper extremity.  Plain radiographs reviewed which show no obvious loosening or migration of her implant.  Vitals  Temp:  [98.5 F (36.9 C)] 98.5 F (36.9 C) (10/18 0854) Pulse Rate:  [51-76] 67 (10/18 1135) Resp:  [9-25] 25 (10/18 1135) BP: (146-179)/(69-87) 170/69 (10/18 1135) SpO2:  [87 %-100 %] 98 % (10/18 1135) Weight:  [70.3 kg] 70.3 kg (10/18 0851)  Assessment/Plan  Impression: unstable left reverse shoulder arthroplasty  Plan of Action: Procedure(s): Revision left reverse shoulder arthroplasty  Amarilys Lyles M Alveena Taira 06/12/2021, 11:52 AM Contact # [phone]

## 2021-06-12 NOTE — Op Note (Signed)
06/12/2021  1:49 PM  PATIENT:   Deborah Frazier  75 y.o. female  PRE-OPERATIVE DIAGNOSIS:  unstable left reverse shoulder arthroplasty  POST-OPERATIVE DIAGNOSIS: Same  PROCEDURE: Revision left shoulder reverse arthroplasty with the removal of the previously placed size 6 humeral stem, upsizing to a size 10 humeral stem with a neutral metaphysis, +6 spacer, +3 constrained polyethylene insert, maintaining the previous glenosphere  SURGEON:  Torben Soloway, Metta Clines M.D.  ASSISTANTS: Jenetta Loges, PA-C  ANESTHESIA:   General endotracheal and interscalene block with Exparel  EBL: Less than 100 cc  SPECIMEN: None  Drains: None   PATIENT DISPOSITION:  PACU - hemodynamically stable.    PLAN OF CARE: Discharge to home after PACU  Brief history:  Deborah Frazier is a 75 year old female well-known to our practice with a medical history significant for rheumatoid arthritis status post an index left shoulder reverse arthroplasty performed in the spring of this year approximately 6 months ago.  She initially did well but then unfortunately developed episodes of recurring instability.  She is now having significant pain and functional limitations and due to her recurrent instability episodes she is brought to the operating at this time for planned revision of her left shoulder reverse arthroplasty.  Preoperatively, I counseled the patient regarding treatment options and risks versus benefits thereof.  Possible surgical complications were all reviewed including potential for bleeding, infection, neurovascular injury, persistent pain, loss of motion, anesthetic complication, failure of the implant, and possible need for additional surgery. They understand and accept and agrees with our planned procedure.   Procedure in detail:  After undergoing routine preop evaluation the patient received prophylactic antibiotics and interscalene block with Exparel was established in the holding area by the  anesthesia department.  She was subsequently placed supine on the operating table and underwent the smooth induction of a general endotracheal anesthesia.  Placed into the beachchair position and appropriately padded and protected.  The left shoulder girdle region was sterilely prepped and draped in standard fashion.  Timeout was called.  A deltopectoral approach left shoulder is made through her previous incision approximately 10 cm in length.  Skin flaps were elevated dissection carried deeply deltopectoral interval was then developed from proximal to distal with the vein taken laterally.  Adhesions were divided from beneath the deltoid and the conjoined tendon was mobilized and retracted medially.  At this point we gained access to the prosthetic joint space and were able to easily dislocate the implant showing marked laxity of the tissues.  The humeral metaphysis was then exposed through the wound and we used a rondure to remove soft tissues around the metaphyseal region.  An osteotome was then used to remove the previously placed polyethylene insert.  We then removed the previously placed spacer.  We assessed stability of the stem and did not initially identify any obvious instability.  The metaphysis was then removed we used flexible osteotomes to circumferentially released the humeral stem which was a size 6 and on doing her x-rays did appear to be undersized.  We ultimately removed the previously placed humeral stem without difficulty and we were able to maintain all of her humeral bone.  Of note however there was some erosions and resorption of the bone around the metaphyseal region.  Once the stem was removed I then used hand reaming progressively up to a size 10 obtaining good bony purchase.  We then broached up to a size 9 and performed a trial reduction which showed very lax soft tissues and  so went ahead and broached up to a size 10 and with this we are very pleased with the overall stability of the  construct and soft tissue balance.  This point a size 10 stem was then opened and we used our previous metaphysis which was applied to the new stem.  Vancomycin powder was then spread liberally into the humeral canal and our size 10 stem was then impacted at approximate 20 degrees retroversion obtaining excellent purchase and fixation.  A series of trial reductions was then performed and we ultimately felt that +9 off the stem gave [place] the best motion stability and soft tissue balance.  We then used our previous +6 spacer which was applied to the metaphysis and then a +3 constrained polyethylene insert was impacted.  A final reduction was then performed which again showed good motion good stability good soft tissue balance much to our satisfaction.  The wound was then copiously irrigated.  Hemostasis was obtained.  The balance of the vancomycin powder was spread liberally throughout the deep soft tissue planes.  The deltopectoral interval was reapproximated with a series of figure-of-eight number Vicryl sutures.  2-0 nylon vertical mattress sutures to close the skin and a Prevena vacuum-assisted dressing device was applied over the skin incision to prevent the formation of a hematoma.  Left arm was then placed to a sling.  The patient was awakened, extubated, and taken to the recovery room in stable condition.  Jenetta Loges, PA-C was utilized as an Environmental consultant throughout this case, essential for help with positioning the patient, positioning extremity, tissue manipulation, implantation of the prosthesis, suture management, wound closure, and intraoperative decision-making.  Marin Shutter MD   Contact # [phone]

## 2021-06-12 NOTE — Discharge Instructions (Signed)
Metta Clines. Supple, M.D., F.A.A.O.S. Orthopaedic Surgery Specializing in Arthroscopic and Reconstructive Surgery of the Shoulder [phone] [address] [place], [place] [address] - Fax [phone]   POST-OP TOTAL SHOULDER REPLACEMENT INSTRUCTIONS  1. Follow up in the office for your first post-op appointment Monday at 10:30  2. Leave current dressing/vacuum device in place until Monday we will remove   3. Wear your sling/immobilizer at all times except to perform the exercises below or to occasionally let your arm dangle by your side to stretch your elbow. You also need to sleep in your sling immobilizer until instructed otherwise. It is ok to remove your sling if you are sitting in a controlled environment and allow your arm to rest in a position of comfort by your side or on your lap with pillows to give your neck and skin a break from the sling. You may remove it to allow arm to dangle by side for hygiene. If you are up walking around and when you go to sleep at night you need to wear it.  4. Range of motion to your elbow, wrist, and hand are encouraged 3-5 times daily. Exercise to your hand and fingers helps to reduce swelling you may experience.   5. Prescriptions for a pain medication and a muscle relaxant are provided for you. It is recommended that if you are experiencing pain that you pain medication alone is not controlling, add the muscle relaxant along with the pain medication which can give additional pain relief. The first 1-2 days is generally the most severe of your pain and then should gradually decrease. As your pain lessens it is recommended that you decrease your use of the pain medications to an "as needed basis'" only and to always comply with the recommended dosages of the pain medications.  6. Pain medications can produce constipation along with their use. If you experience this, the use of an over the counter stool softener or laxative daily is  recommended.   7. For additional questions or concerns, please do not hesitate to call the office. If after hours there is an answering service to forward your concerns to the physician on call.  8.Pain control following an exparel block  To help control your post-operative pain you received a nerve block  performed with Exparel which is a long acting anesthetic (numbing agent) which can provide pain relief and sensations of numbness (and relief of pain) in the operative shoulder and arm for up to 3 days. Sometimes it provides mixed relief, meaning you may still have numbness in certain areas of the arm but can still be able to move  parts of that arm, hand, and fingers. We recommend that your prescribed pain medications  be used as needed. We do not feel it is necessary to "pre medicate" and "stay ahead" of pain.  Taking narcotic pain medications when you are not having any pain can lead to unnecessary and potentially dangerous side effects.    9. Use the ice machine as much as possible in the first 5-7 days from surgery, then you can wean its use to as needed. The ice typically needs to be replaced every 6 hours, instead of ice you can actually freeze water bottles to put in the cooler and then fill water around them to avoid having to purchase ice. You can have spare water bottles freezing to allow you to rotate them once they have melted. Try to have a thin shirt or light cloth  or towel under the ice wrap to protect your skin.   FOR ADDITIONAL INFO ON ICE MACHINE AND INSTRUCTIONS GO TO THE WEBSITE AT  [email or web address]  10.  We recommend that you avoid any dental work or cleaning in the first 3 months following your joint replacement. This is to help minimize the possibility of infection from the bacteria in your mouth that enters your bloodstream during dental work. We also recommend that you take an antibiotic prior to your dental work for the first  year after your shoulder replacement to further help reduce that risk. Please simply contact our office for antibiotics to be sent to your pharmacy prior to dental work.  11. Dental Antibiotics:  In most cases prophylactic antibiotics for Dental procdeures after total joint surgery are not necessary.  Exceptions are as follows:  1. History of prior total joint infection  2. Severely immunocompromised (Organ Transplant, cancer chemotherapy, Rheumatoid biologic meds such as [place])  3. Poorly controlled diabetes (A1C &gt; 8.0, blood glucose over 200)  If you have one of these conditions, contact your surgeon for an antibiotic prescription, prior to your dental procedure.   POST-OP EXERCISES   No exercises yet

## 2021-06-12 NOTE — Transfer of Care (Signed)
Immediate Anesthesia Transfer of Care Note  Patient: Deborah Frazier  Procedure(s) Performed: Revision left reverse shoulder arthroplasty (Left: Shoulder)  Patient Location: PACU  Anesthesia Type:General and Regional  Level of Consciousness: drowsy  Airway & Oxygen Therapy: Patient Spontanous Breathing and Patient connected to face mask oxygen  Post-op Assessment: Report given to RN and Post -op Vital signs reviewed and stable  Post vital signs: Reviewed and stable  Last Vitals:  Vitals Value Taken Time  BP    Temp    Pulse    Resp 9 06/12/21 1406  SpO2    Vitals shown include unvalidated device data.  Last Pain:  Vitals:   06/12/21 1135  TempSrc:   PainSc: 0-No pain      Patients Stated Pain Goal: 4 (01/65/80 0634)  Complications: No notable events documented.

## 2021-06-12 NOTE — Anesthesia Procedure Notes (Signed)
Anesthesia Regional Block: Interscalene brachial plexus block   Pre-Anesthetic Checklist: , timeout performed,  Correct Patient, Correct Site, Correct Laterality,  Correct Procedure, Correct Position, site marked,  Risks and benefits discussed,  Pre-op evaluation,  At surgeon's request and post-op pain management  Laterality: Left  Prep: Maximum Sterile Barrier Precautions used, chloraprep       Needles:  Injection technique: Single-shot  Needle Type: Echogenic Stimulator Needle     Needle Length: 5cm  Needle Gauge: 22     Additional Needles:   Procedures:,,,, ultrasound used (permanent image in chart),,    Narrative:  Start time: 06/12/2021 11:20 AM End time: 06/12/2021 11:30 AM Injection made incrementally with aspirations every 5 mL.  Performed by: Personally  Anesthesiologist: Roderic Palau, MD  Additional Notes: 2% Lidocaine skin wheel.

## 2021-06-12 NOTE — Anesthesia Procedure Notes (Signed)
Procedure Name: Intubation Date/Time: 06/12/2021 12:13 PM Performed by: Eulas Post, Raequon Catanzaro W, CRNA Pre-anesthesia Checklist: Patient identified, Emergency Drugs available, Suction available and Patient being monitored Patient Re-evaluated:Patient Re-evaluated prior to induction Oxygen Delivery Method: Circle system utilized Preoxygenation: Pre-oxygenation with 100% oxygen Induction Type: IV induction Ventilation: Mask ventilation without difficulty Laryngoscope Size: Miller and 2 Grade View: Grade I Tube type: Oral Tube size: 7.0 mm Number of attempts: 1 Airway Equipment and Method: Stylet Placement Confirmation: ETT inserted through vocal cords under direct vision, positive ETCO2 and breath sounds checked- equal and bilateral Secured at: 22 cm Tube secured with: Tape Dental Injury: Teeth and Oropharynx as per pre-operative assessment

## 2021-06-12 NOTE — Progress Notes (Addendum)
AssistedDr. Oren Bracket with left, ultrasound guided, interscalene  block. Side rails up, monitors on throughout procedure. See vital signs in flow sheet. Tolerated Procedure well.

## 2021-06-13 ENCOUNTER — Encounter (HOSPITAL_COMMUNITY): Payer: Self-pay | Admitting: Orthopedic Surgery

## 2021-06-13 NOTE — Anesthesia Postprocedure Evaluation (Signed)
Anesthesia Post Note  Patient: Deborah Frazier  Procedure(s) Performed: Revision left reverse shoulder arthroplasty (Left: Shoulder)     Patient location during evaluation: Other Anesthesia Type: General and Regional Level of consciousness: awake and alert Pain management: pain level controlled Vital Signs Assessment: post-procedure vital signs reviewed and stable Respiratory status: spontaneous breathing, nonlabored ventilation and respiratory function stable Cardiovascular status: blood pressure returned to baseline and stable Postop Assessment: no apparent nausea or vomiting Anesthetic complications: no   No notable events documented.  Last Vitals:  Vitals:   06/12/21 1500 06/12/21 1512  BP: (!) 176/84 (!) 183/91  Pulse: (!) 59 98  Resp: 14   Temp: 36.5 C   SpO2: 92% 94%    Last Pain:  Vitals:   06/12/21 1512  TempSrc: Oral  PainSc: 0-No pain                 [name],[name]

## 2022-03-20 ENCOUNTER — Encounter: Payer: Self-pay | Admitting: Hematology and Oncology

## 2022-03-28 ENCOUNTER — Inpatient Hospital Stay: Payer: Medicare Other

## 2022-03-28 ENCOUNTER — Other Ambulatory Visit: Payer: Self-pay | Admitting: Oncology

## 2022-03-28 ENCOUNTER — Encounter: Payer: Self-pay | Admitting: Oncology

## 2022-03-28 ENCOUNTER — Inpatient Hospital Stay: Payer: Medicare Other | Attending: Oncology | Admitting: Oncology

## 2022-03-28 VITALS — BP 126/78 | HR 107 | Temp 97.4°F | Resp 18 | Ht 63.0 in | Wt 160.3 lb

## 2022-03-28 DIAGNOSIS — Z853 Personal history of malignant neoplasm of breast: Secondary | ICD-10-CM | POA: Diagnosis not present

## 2022-03-28 DIAGNOSIS — Z1231 Encounter for screening mammogram for malignant neoplasm of breast: Secondary | ICD-10-CM

## 2022-03-28 LAB — CBC AND DIFFERENTIAL
HCT: 34 — AB (ref 36–46)
Hemoglobin: 11.3 — AB (ref 12.0–16.0)
Neutrophils Absolute: 2.21
Platelets: 216 10*3/uL (ref 150–400)
WBC: 4.9

## 2022-03-28 LAB — CBC: RBC: 3.54 — AB (ref 3.87–5.11)

## 2022-03-28 NOTE — Progress Notes (Signed)
Deborah Frazier  [address] [place],  [place]  [address] [phone]  Clinic Day:  03/28/22  Referring physician: Algis Greenhouse, MD   CHIEF COMPLAINT:  CC:  Remote history of stage IIA hormone receptor positive breast cancer  Current Treatment:   observation   HISTORY OF PRESENT ILLNESS:  Deborah Frazier is a 76 y.o. 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  She was treated with lumpectomy.  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  1 of 7 lymph nodes was positive for metastasis.  Estrogen and progesterone receptors were positive and her 2 Neu negative.  She received adjuvant chemotherapy with CMF, followed by radiotherapy to the right breast, as well as letrozole 2.5 mg daily for 5 years.  She has remained without evidence of recurrence.  Due to her personal and family history of cancer, we recommended she be tested for hereditary cancer syndromes.  The patient had the [facility] myRisk Hereditary Cancer Panel test last year and this was negative for any genetic mutations or variants of uncertain significance.  She follows up with her rheumatologist and continues to take Simponi Aria (golimumab) treatment for rheumatoid arthritis.  She is still on Eliquis for her atrial fibrillation.  She also has known osteopenia.  Also, he has had a previous splenectomy and cholecystectomy.  She states she does not know when her last colonoscopy was, but she states she was not advised to have another colonoscopy.  She was also hospitalized in 2019 with a small-bowel obstruction.  This resolved with bowel rest.  Review of her records revealed colonoscopy in October 2012 was negative except for small internal hemorrhoids.  CT abdomen and pelvis in November 2019, at the time of her hospitalization, revealed dilated loops of small bowel and small volume ascites due to  small-bowel obstruction, as well as mild biliary ductal dilatation, felt to be most likely secondary to previous cholecystectomy. Annual bilateral mammograms have remained without evidence of malignancy.  At her visit in July 2021, she had worsening anemia iron studies, B12 and folate were normal.  There was no evidence of hemolysis.   INTERVAL HISTORY:  Deborah Frazier is here today for repeat clinical assessment.  She denies any changes in her breasts. Her screening mammogram from 03/19/22 reveals calcifications in the left breast and further evaluation is recommended. I will schedule a diagnostic left mammogram. She denies fevers or chills. She denies pain. Her appetite is good. Her weight has decreased 5 pounds over last year .Her anemia is stable with a hemoglobin of 11.3.  Prior to her visit today she had a bilateral screening mammogram which  I did not reveal any evidence of malignancy.  REVIEW OF SYSTEMS:  Review of Systems  Constitutional:  Negative for appetite change, chills, fatigue, fever and unexpected weight change.  HENT:   Negative for lump/mass, mouth sores and sore throat.   Respiratory:  Negative for cough and shortness of breath.   Cardiovascular:  Negative for chest pain and leg swelling.  Gastrointestinal:  Negative for abdominal pain, constipation, diarrhea, nausea and vomiting.  Endocrine: Negative for hot flashes.  Genitourinary:  Negative for difficulty urinating, dysuria, frequency and hematuria.   Musculoskeletal:  Negative for arthralgias, back pain and myalgias.  Skin:  Negative for rash.  Neurological:  Negative for dizziness and headaches.  Hematological:  Negative for adenopathy. Does not bruise/bleed easily.  Psychiatric/Behavioral:  Negative for depression and sleep disturbance. The patient is not nervous/anxious.      VITALS:  Blood pressure 126/78, pulse (!) 107, temperature (!) 97.4 F (36.3 C), temperature source Oral, resp. rate 18, height 5\' 3"  (1.6 m), weight  160 lb 4.8 oz (72.7 kg), SpO2 98 %.  Wt Readings from Last 3 Encounters:  03/28/22 160 lb 4.8 oz (72.7 kg)  06/12/21 155 lb (70.3 kg)  06/11/21 155 lb (70.3 kg)    Body mass index is 28.4 kg/m.  Performance status (ECOG): 1 - Symptomatic but completely ambulatory  PHYSICAL EXAM:  Physical Exam Vitals and nursing note reviewed.  Constitutional:      General: She is not in acute distress.    Appearance: Normal appearance.  HENT:     Head: Normocephalic and atraumatic.     Mouth/Throat:     Mouth: Mucous membranes are moist.     Pharynx: Oropharynx is clear. No oropharyngeal exudate or posterior oropharyngeal erythema.  Eyes:     General: No scleral icterus.    Extraocular Movements: Extraocular movements intact.     Conjunctiva/sclera: Conjunctivae normal.     Pupils: Pupils are equal, round, and reactive to light.  Cardiovascular:     Rate and Rhythm: Normal rate and regular rhythm.     Heart sounds: Normal heart sounds. No murmur heard.    No friction rub. No gallop.  Pulmonary:     Effort: Pulmonary effort is normal.     Breath sounds: Normal breath sounds. No wheezing, rhonchi or rales.  Chest:  Breasts:    Right: Normal. No swelling, bleeding, inverted nipple, mass, nipple discharge, skin change or tenderness.     Left: Normal. No swelling, bleeding, inverted nipple, mass, nipple discharge, skin change or tenderness.  Abdominal:     General: There is no distension.     Palpations: Abdomen is soft. There is no hepatomegaly, splenomegaly or mass.     Tenderness: There is no abdominal tenderness.  Musculoskeletal:        General: Normal range of motion.     Right hand: Deformity (severe rheumatoid changes) present.     Left hand: Deformity (severe rheumatoid changes) present.     Cervical back: Normal range of motion and neck supple. No tenderness.     Right lower leg: No edema.     Left lower leg: No edema.  Lymphadenopathy:     Cervical: No cervical adenopathy.      Upper Body:     Right upper body: No supraclavicular or axillary adenopathy.     Left upper body: No supraclavicular or axillary adenopathy.     Lower Body: No right inguinal adenopathy. No left inguinal adenopathy.  Skin:    General: Skin is warm and dry.     Coloration: Skin is not jaundiced.     Findings: No rash.  Neurological:     Mental Status: She is alert and oriented to person, place, and time.     Cranial Nerves: No cranial nerve deficit.  Psychiatric:        Mood and Affect: Mood normal.        Behavior: Behavior normal.        Thought Content: Thought content normal.     LABS:      Latest Ref Rng & Units 03/28/2022   12:00 AM 06/11/2021   11:06 AM 03/28/2021   12:00 AM  CBC  WBC  4.9     4.5  4.1  Hemoglobin 12.0 - 16.0 11.3     11.2  12.0   Hematocrit 36 - 46 34     35.0  36   Platelets 150 - 400 K/uL 216     217  162      This result is from an external source.      Latest Ref Rng & Units 06/11/2021   11:06 AM 03/28/2021   12:00 AM 12/18/2020    9:18 AM  CMP  Glucose 70 - 99 mg/dL 95   99   BUN 8 - 23 mg/dL $Remove'16  14  16   'CQdnozk$ Creatinine 0.44 - 1.00 mg/dL 0.47  0.6  0.62   Sodium 135 - 145 mmol/L 139  139  142   Potassium 3.5 - 5.1 mmol/L 4.2  3.7  5.0   Chloride 98 - 111 mmol/L 107  108  108   CO2 22 - 32 mmol/L $RemoveB'27  25  26   'eJLBGBfv$ Calcium 8.9 - 10.3 mg/dL 8.7  8.6  9.2   Alkaline Phos 25 - 125  94    AST 13 - 35  38    ALT 7 - 35  24       ANC is 1.15  No results found for: "CEA1", "CEA" / No results found for: "CEA1", "CEA" No results found for: "PSA1" No results found for: "AES975" No results found for: "CAN125"  No results found for: "TOTALPROTELP", "ALBUMINELP", "A1GS", "A2GS", "BETS", "BETA2SER", "GAMS", "MSPIKE", "SPEI" No results found for: "TIBC", "FERRITIN", "IRONPCTSAT" No results found for: "LDH"  STUDIES:   Calcifications in the left breast on screening mammogram from 03/19/22.  HISTORY:   Past Medical History:  Diagnosis Date   Anemia     Arthritis    Rheumatoid and osteoarthritis   Cancer ([place])    skin,breast  right   Depression    Dysrhythmia    Afib   Hypertension    Hypothyroidism    Pre-diabetes    denies states she is no longer pre DM    Past Surgical History:  Procedure Laterality Date   BREAST SURGERY     CANCER   CHOLECYSTECTOMY     GASTRIC RESTRICTION SURGERY     HERNIA REPAIR     JOINT REPLACEMENT Right    KNEE   REVERSE SHOULDER ARTHROPLASTY Left 12/21/2020   Procedure: REVERSE SHOULDER ARTHROPLASTY;  Surgeon: Justice Britain, MD;  Location: [facility];  Service: Orthopedics;  Laterality: Left;  119min   TONSILLECTOMY     TOTAL SHOULDER REVISION Left 06/12/2021   Procedure: Revision left reverse shoulder arthroplasty;  Surgeon: Justice Britain, MD;  Location: [facility];  Service: Orthopedics;  Laterality: Left;    Family History  Problem Relation Age of Onset   Arthritis Mother    Hypertension Mother    Cancer Father        CANCER    Social History:  reports that she has never smoked. She has never used smokeless tobacco. She reports that she does not drink alcohol and does not use drugs.The patient is alone today.  Allergies:  Allergies  Allergen Reactions   Infliximab Rash    Current Medications: Current Outpatient Medications  Medication Sig Dispense Refill   amLODipine (NORVASC) 5 MG tablet Take 5 mg by mouth daily.     apixaban (ELIQUIS) 5 MG TABS tablet Take 5 mg by mouth 2 (two) times daily.     atorvastatin (LIPITOR) 80 MG tablet Take 80 mg by mouth daily.  Biotin 10 MG TABS Take 10 mg by mouth every 7 (seven) days.     Calcium Carb-Cholecalciferol (CALCIUM 600 + D PO) Take 1 tablet by mouth daily.     docusate sodium (COLACE) 100 MG capsule Take 100 mg by mouth 2 (two) times daily.     furosemide (LASIX) 20 MG tablet Take 20 mg by mouth daily as needed for edema.     golimumab (SIMPONI ARIA) 50 MG/4ML SOLN injection Inject 50 mg into the vein every 8 (eight) weeks.     leflunomide  (ARAVA) 20 MG tablet Take 20 mg by mouth daily.     levothyroxine (SYNTHROID) 25 MCG tablet Take 25 mcg by mouth daily before breakfast.     losartan (COZAAR) 100 MG tablet      metoprolol succinate (TOPROL-XL) 25 MG 24 hr tablet Take 25 mg by mouth daily.     Multiple Vitamin (MULTIVITAMIN ADULT) TABS Take 1 tablet by mouth daily.     PARoxetine (PAXIL) 30 MG tablet Take 30 mg by mouth every morning.     rivaroxaban (XARELTO) 20 MG TABS tablet Take by mouth.     vitamin B-12 (CYANOCOBALAMIN) 1000 MCG tablet Take 1,000 mcg by mouth daily.     ondansetron (ZOFRAN) 4 MG tablet Take 1 tablet (4 mg total) by mouth every 8 (eight) hours as needed for nausea or vomiting. (Patient not taking: No sig reported) 10 tablet 0   No current facility-administered medications for this visit.     ASSESSMENT & PLAN:   Assessment:   1. 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  She received adjuvant chemotherapy with CMF, followed by radiotherapy to the right breast, as well as letrozole 2.5 mg daily for 5 years.  She remains without evidence of disease.  2. Mild anemia, which is stable. 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  3. Rheumatoid arthritis, for which she remains on golimumab.  4. Osteopenia.  She continues to take calcium and vitamin-D.    5. New calcifications in the left breast, I will order a diagnostic mammogram.  I have explained this to her. Breasts are normal to exam.     Plan:  I will schedule a diagnostic left mammogram and follow up on this. I will plan to see her back in 1 year with a CBC, comprehensive metabolic panel and bilateral screening mammogram for repeat clinical assessment. The patient understands the plans discussed today and is in agreement with them.  She knows to contact our office if she develops concerns prior to her next appointment.    Derwood Kaplan, MD

## 2022-03-29 ENCOUNTER — Other Ambulatory Visit: Payer: Self-pay | Admitting: Oncology

## 2022-04-04 ENCOUNTER — Other Ambulatory Visit: Payer: Self-pay | Admitting: Oncology

## 2022-04-04 DIAGNOSIS — Z853 Personal history of malignant neoplasm of breast: Secondary | ICD-10-CM

## 2022-04-04 DIAGNOSIS — R921 Mammographic calcification found on diagnostic imaging of breast: Secondary | ICD-10-CM

## 2022-04-05 ENCOUNTER — Encounter: Payer: Self-pay | Admitting: Oncology

## 2022-04-10 ENCOUNTER — Telehealth: Payer: Self-pay | Admitting: Oncology

## 2022-04-10 ENCOUNTER — Other Ambulatory Visit: Payer: Self-pay | Admitting: Oncology

## 2022-04-10 DIAGNOSIS — Z853 Personal history of malignant neoplasm of breast: Secondary | ICD-10-CM

## 2022-04-10 DIAGNOSIS — R921 Mammographic calcification found on diagnostic imaging of breast: Secondary | ICD-10-CM

## 2022-04-10 NOTE — Telephone Encounter (Signed)
04/10/22 Spoke with patient and scheduled breast biopsy at [facility] on 04/22/22 arrive at 1115am.

## 2022-04-11 ENCOUNTER — Telehealth: Payer: Self-pay

## 2022-04-11 NOTE — Telephone Encounter (Signed)
Attempted to call patient, Line busy

## 2022-04-22 ENCOUNTER — Ambulatory Visit
Admission: RE | Admit: 2022-04-22 | Discharge: 2022-04-22 | Disposition: A | Discharge status: Home / Self Care | Payer: Medicare Other | Source: Ambulatory Visit | Attending: Oncology | Admitting: Oncology

## 2022-04-22 DIAGNOSIS — R921 Mammographic calcification found on diagnostic imaging of breast: Secondary | ICD-10-CM

## 2022-04-22 DIAGNOSIS — Z853 Personal history of malignant neoplasm of breast: Secondary | ICD-10-CM

## 2022-04-23 ENCOUNTER — Other Ambulatory Visit: Payer: Self-pay | Admitting: Oncology

## 2022-04-23 DIAGNOSIS — D0512 Intraductal carcinoma in situ of left breast: Secondary | ICD-10-CM | POA: Insufficient documentation

## 2022-04-23 DIAGNOSIS — Z853 Personal history of malignant neoplasm of breast: Secondary | ICD-10-CM

## 2022-04-23 NOTE — Progress Notes (Signed)
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  She was treated with lumpectomy.  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  1 of 7 lymph nodes was positive for metastasis.  Estrogen and progesterone receptors were positive and her 2 Neu negative.  She received adjuvant chemotherapy with CMF, followed by radiotherapy to the right breast, as well as letrozole 2.5 mg daily for 5 years.  She has remained without evidence of recurrence.  Due to her personal and family history of cancer, we recommended she be tested for hereditary cancer syndromes.  The patient had the [facility] myRisk Hereditary Cancer Panel test last year and this was negative for any genetic mutations or variants of uncertain significance.  She follows up with her rheumatologist and continues to take Simponi Aria (golimumab) treatment for rheumatoid arthritis.  She is still on Eliquis for her atrial fibrillation.  She also has known osteopenia.  Also, he has had a previous splenectomy and cholecystectomy.  She states she does not know when her last colonoscopy was, but she states she was not advised to have another colonoscopy.  She was also hospitalized in 2019 with a small-bowel obstruction.  This resolved with bowel rest.  Review of her records revealed colonoscopy in October 2012 was negative except for small internal hemorrhoids.  CT abdomen and pelvis in November 2019, at the time of her hospitalization, revealed dilated loops of small bowel and small volume ascites due  to small-bowel obstruction, as well as mild biliary ductal dilatation, felt to be most likely secondary to previous cholecystectomy. Annual bilateral mammograms have remained without evidence of malignancy.  At her visit in July 2021, she had worsening anemia iron studies, B12 and folate were normal.  There was no evidence of hemolysis.   INTERVAL HISTORY:  Deborah Frazier is here today for a new diagnosis.  I had seen her for routine follow up on 03/28/22 but her screening mammogram had revealed new calcifications and so I ordered a diagnostic left mammogram, which was done 8/10. This revealed indeterminate calcifications spanning 14 mm in the upper outer quadrant. A stereotactic biopsy was recommended and performed on 8/28. 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 She is here with her husband to discuss this new finding. She has no palpable lesion. Breast prognostic profile is pending.  She denies fevers or chills. She denies pain. Her appetite is good. Her weight has decreased 3 pounds over the last month..Her anemia is improved with a hemoglobin of 11.9, and the rest of her CBC and CMP is normal.    REVIEW OF SYSTEMS:  Review of Systems  Constitutional:  Negative for appetite change, chills, fatigue, fever and unexpected weight change.  HENT:   Negative for lump/mass, mouth sores and sore throat.   Respiratory:  Negative for cough and shortness of breath.   Cardiovascular:  Negative for chest pain and leg swelling.  Gastrointestinal:  Negative for abdominal pain, constipation, diarrhea, nausea and vomiting.  Endocrine: Negative for hot flashes.  Genitourinary:  Negative for difficulty urinating, dysuria, frequency and hematuria.   Musculoskeletal:  Negative for arthralgias, back pain and myalgias.  Skin:  Negative for rash.  Neurological:  Negative for dizziness and headaches.  Hematological:  Negative for adenopathy. Does not bruise/bleed easily.  Psychiatric/Behavioral:   Negative for depression and sleep disturbance. The patient is not nervous/anxious.      VITALS:  Blood pressure (!) 155/90, pulse 97, temperature 97.6 F (36.4 C), temperature source Oral, resp. rate 17, height 5\' 3"  (1.6 m), weight 157 lb 3.2 oz (71.3 kg), SpO2 98 %.  Wt Readings from Last 3 Encounters:  04/24/22 157 lb 3.2 oz (71.3 kg)  03/28/22 160 lb 4.8 oz (72.7 kg)  06/12/21 155 lb (70.3 kg)    Body mass index is 27.85 kg/m.  Performance status (ECOG): 1 - Symptomatic but completely ambulatory  PHYSICAL EXAM:  Physical Exam Vitals and nursing note reviewed.  Constitutional:      General: She is not in acute distress.    Appearance: Normal appearance.  HENT:     Head: Normocephalic and atraumatic.     Mouth/Throat:     Mouth: Mucous membranes are moist.     Pharynx: Oropharynx is clear. No oropharyngeal exudate or posterior oropharyngeal erythema.  Eyes:     General: No scleral icterus.    Extraocular Movements: Extraocular movements intact.     Conjunctiva/sclera: Conjunctivae normal.     Pupils: Pupils are equal, round, and reactive to light.  Cardiovascular:     Rate and Rhythm: Normal rate and regular rhythm.     Heart sounds: Normal heart sounds. No murmur heard.    No friction rub. No gallop.  Pulmonary:     Effort: Pulmonary effort is normal.     Breath sounds: Normal breath sounds. No wheezing, rhonchi or rales.  Chest:  Breasts:    Right: Normal. No swelling, bleeding, inverted nipple, mass, nipple discharge, skin change or tenderness.     Left: Normal. No swelling, bleeding, inverted nipple, mass, nipple discharge, skin change or tenderness.     Comments: She has a scar of the lower outer quadrant of the right breast and of the right axilla. There is a tiny firm area of the upper outer quadrant of the left breast at the site of her biopsy.  She has a scar of the right central upper chest where she had a prior skin cancer removed. There is an erythematous  area superior to that which is raised and slightly scaly.  Abdominal:     General: There is no distension.     Palpations: Abdomen is soft. There is no hepatomegaly, splenomegaly or mass.     Tenderness: There is no abdominal tenderness.  Musculoskeletal:        General: Normal range of motion.     Right hand: Deformity (severe rheumatoid changes) present.     Left hand: Deformity (severe rheumatoid changes) present.     Cervical back: Normal range of motion and neck supple. No tenderness.     Right lower leg: No edema.     Left lower leg: No edema.  Lymphadenopathy:     Cervical: No cervical adenopathy.     Upper Body:     Right upper body: No supraclavicular or axillary adenopathy.     Left upper body: No supraclavicular or axillary adenopathy.     Lower Body: No right inguinal adenopathy. No left inguinal adenopathy.  Skin:    General: Skin is warm and dry.  Coloration: Skin is not jaundiced.     Findings: No rash.  Neurological:     Mental Status: She is alert and oriented to person, place, and time.     Cranial Nerves: No cranial nerve deficit.  Psychiatric:        Mood and Affect: Mood normal.        Behavior: Behavior normal.        Thought Content: Thought content normal.    LABS:      Latest Ref Rng & Units 04/24/2022   12:00 AM 03/28/2022   12:00 AM 06/11/2021   11:06 AM  CBC  WBC  4.8     4.9     4.5   Hemoglobin 12.0 - 16.0 11.9     11.3     11.2   Hematocrit 36 - 46 36     34     35.0   Platelets 150 - 400 K/uL 241     216     217      This result is from an external source.      Latest Ref Rng & Units 04/24/2022   12:00 AM 06/11/2021   11:06 AM 03/28/2021   12:00 AM  CMP  Glucose 70 - 99 mg/dL  95    BUN 4 - $R'21 14     16  14   'EB$ Creatinine 0.5 - 1.1 0.7     0.47  0.6   Sodium 137 - 147 138     139  139   Potassium 3.5 - 5.1 mEq/L 4.0     4.2  3.7   Chloride 99 - 108 105     107  108   CO2 13 - $Re'22 29     27  25   'Mnu$ Calcium 8.7 - 10.7 9.2     8.7  8.6    Alkaline Phos 25 - 125 105      94   AST 13 - 35 40      38   ALT 7 - 35 U/L 28      24      This result is from an external source.     ANC is 1.15  No results found for: "CEA1", "CEA" / No results found for: "CEA1", "CEA" No results found for: "PSA1" No results found for: "LHT342" No results found for: "CAN125"  No results found for: "TOTALPROTELP", "ALBUMINELP", "A1GS", "A2GS", "BETS", "BETA2SER", "GAMS", "MSPIKE", "SPEI" No results found for: "TIBC", "FERRITIN", "IRONPCTSAT" No results found for: "LDH"  STUDIES:   Calcifications in the left breast on screening mammogram from 03/19/22.  HISTORY:   Past Medical History:  Diagnosis Date   Anemia    Arthritis    Rheumatoid and osteoarthritis   Cancer ([place])    skin,breast  right   Depression    Dysrhythmia    Afib   Hypertension    Hypothyroidism    Pre-diabetes    denies states she is no longer pre DM    Past Surgical History:  Procedure Laterality Date   BREAST SURGERY     CANCER   CHOLECYSTECTOMY     GASTRIC RESTRICTION SURGERY     HERNIA REPAIR     JOINT REPLACEMENT Right    KNEE   REVERSE SHOULDER ARTHROPLASTY Left 12/21/2020   Procedure: REVERSE SHOULDER ARTHROPLASTY;  Surgeon: Justice Britain, MD;  Location: [facility];  Service: Orthopedics;  Laterality: Left;  134min   TONSILLECTOMY     TOTAL  SHOULDER REVISION Left 06/12/2021   Procedure: Revision left reverse shoulder arthroplasty;  Surgeon: Justice Britain, MD;  Location: [facility];  Service: Orthopedics;  Laterality: Left;    Family History  Problem Relation Age of Onset   Arthritis Mother    Hypertension Mother    Cancer Father        CANCER    Social History:  reports that she has never smoked. She has never used smokeless tobacco. She reports that she does not drink alcohol and does not use drugs.The patient is alone today.  Allergies:  Allergies  Allergen Reactions   Infliximab Rash    Current Medications: Current Outpatient Medications   Medication Sig Dispense Refill   amLODipine (NORVASC) 5 MG tablet Take 5 mg by mouth daily.     apixaban (ELIQUIS) 5 MG TABS tablet Take 5 mg by mouth 2 (two) times daily.     atorvastatin (LIPITOR) 80 MG tablet Take 80 mg by mouth daily.     Biotin 10 MG TABS Take 10 mg by mouth every 7 (seven) days.     Calcium Carb-Cholecalciferol (CALCIUM 600 + D PO) Take 1 tablet by mouth daily.     docusate sodium (COLACE) 100 MG capsule Take 100 mg by mouth 2 (two) times daily.     furosemide (LASIX) 20 MG tablet Take 20 mg by mouth daily as needed for edema.     golimumab (SIMPONI ARIA) 50 MG/4ML SOLN injection Inject 50 mg into the vein every 8 (eight) weeks.     leflunomide (ARAVA) 20 MG tablet Take 20 mg by mouth daily.     levothyroxine (SYNTHROID) 25 MCG tablet Take 25 mcg by mouth daily before breakfast.     losartan (COZAAR) 100 MG tablet      metoprolol succinate (TOPROL-XL) 25 MG 24 hr tablet Take 25 mg by mouth daily.     Multiple Vitamin (MULTIVITAMIN ADULT) TABS Take 1 tablet by mouth daily.     ondansetron (ZOFRAN) 4 MG tablet Take 1 tablet (4 mg total) by mouth every 8 (eight) hours as needed for nausea or vomiting. (Patient not taking: No sig reported) 10 tablet 0   PARoxetine (PAXIL) 30 MG tablet Take 30 mg by mouth every morning.     rivaroxaban (XARELTO) 20 MG TABS tablet Take by mouth.     vitamin B-12 (CYANOCOBALAMIN) 1000 MCG tablet Take 1,000 mcg by mouth daily.     No current facility-administered medications for this visit.     ASSESSMENT & PLAN:   Assessment:   1. 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  She received adjuvant chemotherapy with CMF, followed by radiotherapy to the right breast, as well as letrozole 2.5 mg daily for 5 years.  She remains without evidence of disease.  2. Mild anemia, which is stable. 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  3. Rheumatoid  arthritis, for which she remains on golimumab.  4. Osteopenia.  She continues to take calcium and vitamin-D.    5. Ductal carcinoma in situ of the left breast, discovered by calcifications in he upper oute quardant.  6. History of skin cancer of the upper anterior central chest with newer erythematous area superior to that.  We will ask the surgeon's opinion but she may need to see the dermatologist again.   Plan:   I have explained the diagnosis and prognosis to the patient and her husband and answered their questions.  I noted this is much different than the invasive breast cancer she had years ago. We will refer her to Dr. Orrin Brigham for lumpectomy and I discussed possible chemoprevention afterwards.  She is concerned about her history of left shoulder and right total knee replacements and whether she would need antibiotic coverage. She is also asking me about the increasing scaly erythematous area of her superior anterior chest, and we will ask the opinion of the surgeon. I will plan to see her back in 1 month to review the pathology report.. The patient understands the plans discussed today and is in agreement with them.  She knows to contact our office if she develops concerns prior to her next appointment.    Derwood Kaplan, MD

## 2022-04-24 ENCOUNTER — Encounter: Payer: Self-pay | Admitting: Oncology

## 2022-04-24 ENCOUNTER — Inpatient Hospital Stay: Payer: Medicare Other

## 2022-04-24 ENCOUNTER — Inpatient Hospital Stay (INDEPENDENT_AMBULATORY_CARE_PROVIDER_SITE_OTHER): Payer: Medicare Other | Admitting: Oncology

## 2022-04-24 DIAGNOSIS — D0512 Intraductal carcinoma in situ of left breast: Secondary | ICD-10-CM

## 2022-04-24 DIAGNOSIS — Z853 Personal history of malignant neoplasm of breast: Secondary | ICD-10-CM

## 2022-04-24 LAB — CBC AND DIFFERENTIAL
HCT: 36 (ref 36–46)
Hemoglobin: 11.9 — AB (ref 12.0–16.0)
Neutrophils Absolute: 2.35
Platelets: 241 10*3/uL (ref 150–400)
WBC: 4.8

## 2022-04-24 LAB — BASIC METABOLIC PANEL
BUN: 14 (ref 4–21)
CO2: 29 — AB (ref 13–22)
Chloride: 105 (ref 99–108)
Creatinine: 0.7 (ref 0.5–1.1)
Glucose: 99
Potassium: 4 mEq/L (ref 3.5–5.1)
Sodium: 138 (ref 137–147)

## 2022-04-24 LAB — COMPREHENSIVE METABOLIC PANEL
Albumin: 3.9 (ref 3.5–5.0)
Calcium: 9.2 (ref 8.7–10.7)

## 2022-04-24 LAB — HEPATIC FUNCTION PANEL
ALT: 28 U/L (ref 7–35)
AST: 40 — AB (ref 13–35)
Alkaline Phosphatase: 105 (ref 25–125)
Bilirubin, Total: 0.8

## 2022-04-24 LAB — CBC: RBC: 3.77 — AB (ref 3.87–5.11)

## 2022-04-24 NOTE — Progress Notes (Signed)
Face to face with pt and her husband in exam room. Pt is here for Med Onc followup with Dr. Hinton Rao with her newly diagnosis of DCIS. Copy of "The Breast Cancer Treatment Handbook " given. Pt navigator contact info given.

## 2022-04-26 ENCOUNTER — Other Ambulatory Visit: Payer: Self-pay

## 2022-04-26 DIAGNOSIS — D0512 Intraductal carcinoma in situ of left breast: Secondary | ICD-10-CM

## 2022-05-21 ENCOUNTER — Ambulatory Visit: Payer: Medicare Other | Admitting: Oncology

## 2022-05-21 ENCOUNTER — Other Ambulatory Visit: Payer: Medicare Other

## 2022-06-06 NOTE — Progress Notes (Signed)
[name]  [address] 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  She was treated with lumpectomy.  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  1 of 7 lymph nodes was positive for metastasis.  Estrogen and progesterone receptors were positive and her 2 Neu negative.  She received adjuvant chemotherapy with CMF, followed by radiotherapy to the right breast, as well as letrozole 2.5 mg daily for 5 years.  She has remained without evidence of recurrence.  Due to her personal and family history of cancer, we recommended she be tested for hereditary cancer syndromes.  The patient had the [facility] myRisk Hereditary Cancer Panel test last year and this was negative for any genetic mutations or variants of uncertain significance.  She follows up with her rheumatologist and continues to take Simponi Aria (golimumab) treatment for rheumatoid arthritis.  She is still on Eliquis for her atrial fibrillation.  She also has known osteopenia.  Also, he has had a previous splenectomy and cholecystectomy.  She states she does not know when her last colonoscopy was, but she states she was not advised to have another colonoscopy.  She was also hospitalized in 2019 with a small-bowel obstruction.  This resolved with bowel rest.  Review of her records revealed colonoscopy in October 2012 was negative except for small internal hemorrhoids.  CT abdomen and pelvis in November 2019, at the time of her hospitalization, revealed dilated loops of small bowel and small volume  ascites due to small-bowel obstruction, as well as mild biliary ductal dilatation, felt to be most likely secondary to previous cholecystectomy. Annual bilateral mammograms have remained without evidence of malignancy.  At her visit in July 2021, she had worsening anemia iron studies, B12 and folate were normal.  There was no evidence of hemolysis.   INTERVAL HISTORY:  Deborah Frazier is here today for a new diagnosis of DCIS. She has now had her lumpectomy of the left breast. She states she has been healing well since her surgery 06/06/22. 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 ER and PR were negative. The resection margins are negative for DCIS, the closest superior margin at 0.2 cm. She notes she takes vitamin D supplements. I have suggested getting a bone density scan. I also have recommended a hormone blocker for chemoprevention since she has had two breast cancers now. CBC and CMP are normal with hemoglobin up to 12.1 now. She denies fevers or chills. She denies pain. Her appetite is good. Her weight has increased 5 pounds since her last visit.   REVIEW OF SYSTEMS:  Review of Systems  Constitutional:  Negative for appetite change, chills, fatigue, fever and unexpected weight change.  HENT:   Negative for lump/mass, mouth sores and sore throat.   Respiratory:  Negative for cough and shortness of breath.   Cardiovascular:  Negative for chest pain and leg swelling.  Gastrointestinal:  Negative for abdominal pain, constipation, diarrhea, nausea and vomiting.  Endocrine: Negative for hot flashes.  Genitourinary:  Negative for difficulty urinating, dysuria, frequency and hematuria.   Musculoskeletal:  Negative for arthralgias, back pain and myalgias.  Skin:  Negative for rash.  Neurological:  Negative for dizziness and headaches.  Hematological:  Negative for adenopathy. Does not bruise/bleed easily.  Psychiatric/Behavioral:   Negative for depression and sleep disturbance. The patient is not nervous/anxious.      VITALS:  Blood pressure 133/77, pulse (!) 101, temperature (!) 97.5 F (36.4 C), temperature source Oral, resp. rate 18, height $RemoveBe'5\' 3"'zEbpICoYL$  (1.6 m), weight 162 lb 8 oz (73.7 kg), SpO2 98 %.  Wt Readings from Last 3 Encounters:  06/11/22 162 lb 8 oz (73.7 kg)  04/24/22 157 lb 3.2 oz (71.3 kg)  03/28/22 160 lb 4.8 oz (72.7 kg)    Body mass index is 28.79 kg/m.  Performance status (ECOG): 1 - Symptomatic but completely ambulatory  PHYSICAL EXAM:  Physical Exam Vitals and nursing note reviewed.  Constitutional:      General: She is not in acute distress.    Appearance: Normal appearance.  HENT:     Head: Normocephalic and atraumatic.     Mouth/Throat:     Mouth: Mucous membranes are moist.     Pharynx: Oropharynx is clear. No oropharyngeal exudate or posterior oropharyngeal erythema.  Eyes:     General: No scleral icterus.    Extraocular Movements: Extraocular movements intact.     Conjunctiva/sclera: Conjunctivae normal.     Pupils: Pupils are equal, round, and reactive to light.  Cardiovascular:     Rate and Rhythm: Normal rate and regular rhythm.     Heart sounds: Normal heart sounds. No murmur heard.    No friction rub. No gallop.  Pulmonary:     Effort: Pulmonary effort is normal.     Breath sounds: Normal breath sounds. No wheezing, rhonchi or rales.  Chest:  Breasts:    Right: Normal. No swelling, bleeding, inverted nipple, mass, nipple discharge, skin change or tenderness.     Left: Normal. No swelling, bleeding, inverted nipple, mass, nipple discharge, skin change or tenderness.     Comments: Healing scar in the far lateral left breast nearly in the axillary line. Old scar in the lower outer quadrant of the right breast.  Abdominal:     General: There is no distension.     Palpations: Abdomen is soft. There is no hepatomegaly, splenomegaly or mass.     Tenderness: There is no  abdominal tenderness.  Musculoskeletal:        General: Deformity present. Normal range of motion.     Right hand: Deformity (severe rheumatoid changes) present.     Left hand: Deformity (severe rheumatoid changes) present.     Cervical back: Normal range of motion and neck supple. No tenderness.     Right lower leg: No edema.     Left lower leg: No edema.     Comments: Large scars of both shoulders.  Lymphadenopathy:     Cervical: No cervical adenopathy.     Upper Body:     Right upper body: No supraclavicular or axillary adenopathy.     Left upper body: No supraclavicular or axillary adenopathy.     Lower Body: No right inguinal adenopathy. No left inguinal adenopathy.  Skin:    General: Skin is warm and dry.     Coloration: Skin is not jaundiced.     Findings: No rash.  Neurological:     Mental Status: She is alert and oriented to person, place, and time.  Cranial Nerves: No cranial nerve deficit.  Psychiatric:        Mood and Affect: Mood normal.        Behavior: Behavior normal.        Thought Content: Thought content normal.     LABS:      Latest Ref Rng & Units 06/11/2022   12:00 AM 04/24/2022   12:00 AM 03/28/2022   12:00 AM  CBC  WBC  4.6     4.8     4.9      Hemoglobin 12.0 - 16.0 12.1     11.9     11.3      Hematocrit 36 - 46 36     36     34      Platelets 150 - 400 K/uL 244     241     216         This result is from an external source.      Latest Ref Rng & Units 06/11/2022   12:00 AM 04/24/2022   12:00 AM 06/11/2021   11:06 AM  CMP  Glucose 70 - 99 mg/dL   95   BUN 4 - $R'21 18     14     16   'eI$ Creatinine 0.5 - 1.1 0.8     0.7     0.47   Sodium 137 - 147 137     138     139   Potassium 3.5 - 5.1 mEq/L 3.8     4.0     4.2   Chloride 99 - 108 107     105     107   CO2 13 - $Re'22 26     29     27   'qsF$ Calcium 8.7 - 10.7 9.0     9.2     8.7   Alkaline Phos 25 - 125 82     105       AST 13 - 35 38     40       ALT 7 - 35 U/L 30     28          This result  is from an external source.     ANC is 1.15  No results found for: "CEA1", "CEA" / No results found for: "CEA1", "CEA" No results found for: "PSA1" No results found for: "KCL275" No results found for: "CAN125"  No results found for: "TOTALPROTELP", "ALBUMINELP", "A1GS", "A2GS", "BETS", "BETA2SER", "GAMS", "MSPIKE", "SPEI" No results found for: "TIBC", "FERRITIN", "IRONPCTSAT" No results found for: "LDH"  STUDIES:  PATH REPORT DCIS 06/06/2022 Breast, lumpectomy, left mass, short superior, long lateral. Ductal carcinoma in situ, high grade, with necrosis and calcifications Resection margins are negative for DCIS; closest is the superior margin at 0.2 cm Biopsy site changes      Calcifications in the left breast on screening mammogram from 03/19/22.  HISTORY:   Past Medical History:  Diagnosis Date   Anemia    Arthritis    Rheumatoid and osteoarthritis   Cancer ([place])    skin,breast  right   Depression    Dysrhythmia    Afib   Hypertension    Hypothyroidism    Pre-diabetes    denies states she is no longer pre DM    Past Surgical History:  Procedure Laterality Date   BREAST SURGERY     CANCER   CHOLECYSTECTOMY     GASTRIC RESTRICTION SURGERY  HERNIA REPAIR     JOINT REPLACEMENT Right    KNEE   REVERSE SHOULDER ARTHROPLASTY Left 12/21/2020   Procedure: REVERSE SHOULDER ARTHROPLASTY;  Surgeon: Justice Britain, MD;  Location: [facility];  Service: Orthopedics;  Laterality: Left;  151min   TONSILLECTOMY     TOTAL SHOULDER REVISION Left 06/12/2021   Procedure: Revision left reverse shoulder arthroplasty;  Surgeon: Justice Britain, MD;  Location: [facility];  Service: Orthopedics;  Laterality: Left;    Family History  Problem Relation Age of Onset   Arthritis Mother    Hypertension Mother    Cancer Father        CANCER    Social History:  reports that she has never smoked. She has never used smokeless tobacco. She reports that she does not drink alcohol and does not  use drugs.The patient is alone today.  Allergies:  Allergies  Allergen Reactions   Infliximab Rash    Current Medications: Current Outpatient Medications  Medication Sig Dispense Refill   amiodarone (PACERONE) 200 MG tablet Take by mouth.     amLODipine (NORVASC) 5 MG tablet Take 5 mg by mouth daily.     apixaban (ELIQUIS) 5 MG TABS tablet Take 5 mg by mouth 2 (two) times daily.     atorvastatin (LIPITOR) 80 MG tablet Take 80 mg by mouth daily.     Biotin 10 MG TABS Take 10 mg by mouth every 7 (seven) days.     Calcium Carb-Cholecalciferol (CALCIUM 600 + D PO) Take 1 tablet by mouth daily.     docusate sodium (COLACE) 100 MG capsule Take 100 mg by mouth 2 (two) times daily.     furosemide (LASIX) 20 MG tablet Take 20 mg by mouth daily as needed for edema.     golimumab (SIMPONI ARIA) 50 MG/4ML SOLN injection Inject 50 mg into the vein every 8 (eight) weeks.     leflunomide (ARAVA) 20 MG tablet Take 20 mg by mouth daily.     levothyroxine (SYNTHROID) 25 MCG tablet Take 25 mcg by mouth daily before breakfast.     losartan (COZAAR) 100 MG tablet      metoprolol succinate (TOPROL-XL) 25 MG 24 hr tablet Take 25 mg by mouth daily.     Multiple Vitamin (MULTIVITAMIN ADULT) TABS Take 1 tablet by mouth daily.     ondansetron (ZOFRAN) 4 MG tablet Take 1 tablet (4 mg total) by mouth every 8 (eight) hours as needed for nausea or vomiting. (Patient not taking: No sig reported) 10 tablet 0   PARoxetine (PAXIL) 30 MG tablet Take 30 mg by mouth every morning.     raloxifene (EVISTA) 60 MG tablet Take 1 tablet (60 mg total) by mouth daily. 30 tablet 5   rivaroxaban (XARELTO) 20 MG TABS tablet Take by mouth.     vitamin B-12 (CYANOCOBALAMIN) 1000 MCG tablet Take 1,000 mcg by mouth daily.     No current facility-administered medications for this visit.     ASSESSMENT & PLAN:   Assessment:   1. 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  She received adjuvant chemotherapy with CMF,  followed by radiotherapy to the right breast, as well as letrozole 2.5 mg daily for 5 years.  She remains without evidence of disease.  2. Rheumatoid arthritis, for which she remains on golimumab.  3. Osteopenia.  She continues to take calcium and vitamin-D.  I will schedule a new bone density scan.  4. Ductal carcinoma in situ of the left breast,  discovered by calcifications in he upper oute quardant. Diagnosed in September of 2023, this was treated with lumpectomy. I have now recommended chemoprevention.    Plan:   Since her surgery on 06/06/22, she has been healing well. This reveals high grade ductal carcinoma in situ with necrosis and calcifications, no invasive cancer was seen. ER and PR were negative. The resection margins are negative for DCIS, the closest superior margin at 0.2 cm. We will schedule a bone density scan prior to her next visit, Otherwise we will plan to see her back in 3 months for reexamination along with bone density scan results. I have offered raloxifene for chemoprevention and she is interested so I will order that, even though her current breast cancer is ER negative, this is the second breast cancer she has had and I think she would benefit from chemoprevention. If she has any significant toxicities, we will stop it. The patient and her husband understand the plans discussed today and is in agreement with them.  She knows to contact our office if she develops concerns prior to her next appointment.    Derwood Kaplan, MD     Ninetta Lights as a scribe for Derwood Kaplan, MD.,have documented all relevant documentation on the behalf of Derwood Kaplan, MD,as directed by  Derwood Kaplan, MD while in the presence of Derwood Kaplan, MD.

## 2022-06-10 ENCOUNTER — Other Ambulatory Visit: Payer: Self-pay | Admitting: Oncology

## 2022-06-10 DIAGNOSIS — D0512 Intraductal carcinoma in situ of left breast: Secondary | ICD-10-CM

## 2022-06-11 ENCOUNTER — Inpatient Hospital Stay: Payer: Medicare Other | Attending: Oncology

## 2022-06-11 ENCOUNTER — Encounter: Payer: Self-pay | Admitting: Oncology

## 2022-06-11 ENCOUNTER — Other Ambulatory Visit: Payer: Self-pay | Admitting: Oncology

## 2022-06-11 ENCOUNTER — Inpatient Hospital Stay (INDEPENDENT_AMBULATORY_CARE_PROVIDER_SITE_OTHER): Payer: Medicare Other | Admitting: Oncology

## 2022-06-11 VITALS — BP 133/77 | HR 101 | Temp 97.5°F | Resp 18 | Ht 63.0 in | Wt 162.5 lb

## 2022-06-11 DIAGNOSIS — D0512 Intraductal carcinoma in situ of left breast: Secondary | ICD-10-CM

## 2022-06-11 DIAGNOSIS — Z853 Personal history of malignant neoplasm of breast: Secondary | ICD-10-CM | POA: Diagnosis not present

## 2022-06-11 DIAGNOSIS — Z79818 Long term (current) use of other agents affecting estrogen receptors and estrogen levels: Secondary | ICD-10-CM

## 2022-06-11 DIAGNOSIS — M05711 Rheumatoid arthritis with rheumatoid factor of right shoulder without organ or systems involvement: Secondary | ICD-10-CM

## 2022-06-11 LAB — CBC AND DIFFERENTIAL
HCT: 36 (ref 36–46)
Hemoglobin: 12.1 (ref 12.0–16.0)
Neutrophils Absolute: 2.21
Platelets: 244 10*3/uL (ref 150–400)
WBC: 4.6

## 2022-06-11 LAB — BASIC METABOLIC PANEL
BUN: 18 (ref 4–21)
CO2: 26 — AB (ref 13–22)
Chloride: 107 (ref 99–108)
Creatinine: 0.8 (ref 0.5–1.1)
Glucose: 93
Potassium: 3.8 mEq/L (ref 3.5–5.1)
Sodium: 137 (ref 137–147)

## 2022-06-11 LAB — COMPREHENSIVE METABOLIC PANEL
Albumin: 3.5 (ref 3.5–5.0)
Calcium: 9 (ref 8.7–10.7)

## 2022-06-11 LAB — HEPATIC FUNCTION PANEL
ALT: 30 U/L (ref 7–35)
AST: 38 — AB (ref 13–35)
Alkaline Phosphatase: 82 (ref 25–125)
Bilirubin, Total: 0.7

## 2022-06-11 LAB — CBC: RBC: 3.75 — AB (ref 3.87–5.11)

## 2022-06-11 MED ORDER — RALOXIFENE HCL 60 MG PO TABS: 60.0000 mg | ORAL_TABLET | Freq: Every day | ORAL | 5 refills | Status: DC

## 2022-06-11 NOTE — Progress Notes (Signed)
Face to face with pt and husband in exam room. Pt has had her surgery and is here to discuss the next steps with Dr. Hinton Rao. Pt will not need chemo and is is very happy about the positive outcome. Pt will start on anti hormonals.

## 2022-07-02 ENCOUNTER — Encounter: Payer: Self-pay | Admitting: Oncology

## 2022-09-09 NOTE — Progress Notes (Signed)
[facility]  [address] 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  She was treated with lumpectomy.  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  1 of 7 lymph nodes was positive for metastasis.  Estrogen and progesterone receptors were positive and her 2 Neu negative.  She received adjuvant chemotherapy with CMF, followed by radiotherapy to the right breast, as well as letrozole 2.5 mg daily for 5 years.  She has remained without evidence of recurrence.  Due to her personal and family history of cancer, we recommended she be tested for hereditary cancer syndromes.  The patient had the [facility] myRisk Hereditary Cancer Panel test last year and this was negative for any genetic mutations or variants of uncertain significance.  She follows up with her rheumatologist and continues to take Simponi Aria (golimumab) treatment for rheumatoid arthritis.  She is still on Eliquis for her atrial fibrillation.  She also has known osteopenia.  Also, he has had a previous splenectomy and cholecystectomy.  She states she does not know when her last colonoscopy was, but she states she was not advised to have another colonoscopy.  She was also hospitalized in 2019 with a small-bowel obstruction.  This resolved with bowel rest.  Review of her records revealed colonoscopy in October 2012 was negative except for small internal hemorrhoids.  CT abdomen and pelvis in November 2019, at the time of her hospitalization, revealed dilated loops of small bowel and small volume  ascites due to small-bowel obstruction, as well as mild biliary ductal dilatation, felt to be most likely secondary to previous cholecystectomy. Annual bilateral mammograms have remained without evidence of malignancy.  At her visit in July 2021, she had worsening anemia iron studies, B12 and folate were normal.  There was no evidence of hemolysis.   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 ER and PR were negative. The resection margins were negative for DCIS, the closest superior margin at 0.2 cm and was high grade ductal carcinoma in situ with necrosis and calcifications, but no invasive cancer was seen. Patient states that she is well but complains that  she has had congestion with clear to occasionally yellow phlegm for the last 3 weeks. She has taken and finished Amoxicillin and Mucinex with little improvement. I advised her that she may need a different type of antibiotic and recommend a Z-pack. She will confer with Dr. Garlon Hatchet. She is still taking her Raloxifene for chemoprevention of breast cancer with no complications. Her bone density scan in November, 2023 showed worsening in the left hip and spine, which is now osteoporosis. She continues taking calcium and vitamin D supplements. The Raloxifene may give her some benefit but I would consider something more. I recommended that she should start taking medication and discussed Alendronate to improve her bone health. She will discuss this with her primary care provider and her Rheumatologist, Dr. Amil Amen. We discussed a couple other alternatives with Prolia or Reclast. I will see her back in 4 months for reevaluation.  She denies signs of infection such as sore throat, sinus drainage, cough, or urinary symptoms.  She denies fevers or recurrent chills. She denies pain. She denies nausea, vomiting, chest pain, dyspnea or cough. Her weight has been stable.   REVIEW OF SYSTEMS:  Review of Systems  Constitutional:  Negative  for appetite change, chills, diaphoresis, fatigue, fever and unexpected weight change.  HENT:  Negative.  Negative for hearing loss, lump/mass, mouth sores, nosebleeds, sore throat, tinnitus, trouble swallowing and voice change.   Eyes: Negative.  Negative for eye problems and icterus.  Respiratory:  Positive for cough (Congestion). Negative for chest tightness, hemoptysis, shortness of breath and wheezing.   Cardiovascular: Negative.  Negative for chest pain, leg swelling and palpitations.  Gastrointestinal: Negative.  Negative for abdominal distention, abdominal pain, blood in stool, constipation, diarrhea, nausea, rectal pain and vomiting.  Endocrine: Negative.  Negative for hot flashes.  Genitourinary: Negative.  Negative for bladder incontinence, difficulty urinating, dyspareunia, dysuria, frequency, hematuria, menstrual problem, nocturia, pelvic pain, vaginal bleeding and vaginal discharge.   Musculoskeletal:  Negative for arthralgias, back pain, flank pain, gait problem, myalgias, neck pain and neck stiffness.  Skin: Negative.  Negative for itching, rash and wound.  Neurological:  Negative for dizziness, extremity weakness, gait problem, headaches, light-headedness, numbness, seizures and speech difficulty.  Hematological: Negative.  Negative for adenopathy. Does not bruise/bleed easily.  Psychiatric/Behavioral: Negative.  Negative for confusion, decreased concentration, depression, sleep disturbance and suicidal ideas. The patient is not nervous/anxious.      VITALS:  Blood pressure 130/81, pulse (!) 111, temperature 97.6 F (36.4 C), temperature source Oral, resp. rate 16, height '5\' 3"'$  (1.6 m), weight 162 lb 6.4 oz (73.7 kg), SpO2 100 %.  Wt Readings from Last 3 Encounters:  09/11/22 162 lb 6.4 oz (73.7 kg)  06/11/22 162 lb 8 oz (73.7 kg)  04/24/22 157 lb 3.2 oz (71.3 kg)    Body mass index is 28.77 kg/m.  Performance status (ECOG): 1 - Symptomatic but completely  ambulatory  PHYSICAL EXAM:  Physical Exam Vitals and nursing note reviewed.  Constitutional:      General: She is not in acute distress.    Appearance: Normal appearance. She is normal weight. She is not ill-appearing, toxic-appearing or diaphoretic.  HENT:     Head: Normocephalic and atraumatic.     Right Ear: Tympanic membrane, ear canal and external ear normal. There is no impacted cerumen.     Left Ear: Tympanic membrane, ear canal and external ear normal. There is no impacted cerumen.     Nose: Nose normal. No congestion or rhinorrhea.     Mouth/Throat:     Mouth: Mucous membranes are moist.     Pharynx: Oropharynx is clear. No oropharyngeal exudate or posterior oropharyngeal erythema.  Eyes:     General: No scleral icterus.       Right eye: No discharge.        Left eye: No discharge.     Extraocular Movements: Extraocular movements intact.     Conjunctiva/sclera: Conjunctivae normal.     Pupils: Pupils are equal, round, and reactive to light.  Neck:     Vascular: No carotid bruit.  Cardiovascular:     Rate and Rhythm: Normal rate and regular rhythm.     Pulses: Normal pulses.     Heart sounds: Normal heart sounds. No murmur heard.    No friction rub. No gallop.  Pulmonary:     Effort: Pulmonary effort is normal. No respiratory distress.  Breath sounds: No stridor. Examination of the left-lower field reveals rales. Rales present. No wheezing or rhonchi.     Comments: In the left base Chest:     Chest wall: No tenderness.  Breasts:    Right: Normal. No swelling, bleeding, inverted nipple, mass, nipple discharge, skin change or tenderness.     Left: Normal. No swelling, bleeding, inverted nipple, mass, nipple discharge, skin change or tenderness.     Comments: Well healed scar in the lateral left breast Well healed scar in the right breast in the lower outer quadrant which is barely visible.  Right axillary scar is well healed. No masses in either breast.  Abdominal:      General: Bowel sounds are normal. There is no distension.     Palpations: Abdomen is soft. There is no hepatomegaly, splenomegaly or mass.     Tenderness: There is no abdominal tenderness. There is no right CVA tenderness, left CVA tenderness, guarding or rebound.     Hernia: No hernia is present.  Musculoskeletal:        General: Deformity present. No swelling, tenderness or signs of injury. Normal range of motion.     Right hand: Deformity (severe rheumatoid changes) present.     Left hand: Deformity (severe rheumatoid changes) present.     Cervical back: Normal range of motion and neck supple. No rigidity or tenderness.     Right lower leg: No edema.     Left lower leg: No edema.     Comments: Large scars of both shoulders.  Lymphadenopathy:     Cervical: No cervical adenopathy.     Upper Body:     Right upper body: No supraclavicular or axillary adenopathy.     Left upper body: No supraclavicular or axillary adenopathy.     Lower Body: No right inguinal adenopathy. No left inguinal adenopathy.  Skin:    General: Skin is warm and dry.     Coloration: Skin is not jaundiced or pale.     Findings: No bruising, erythema, lesion or rash.  Neurological:     General: No focal deficit present.     Mental Status: She is alert and oriented to person, place, and time. Mental status is at baseline.     Cranial Nerves: No cranial nerve deficit.     Sensory: No sensory deficit.     Motor: No weakness.     Coordination: Coordination normal.     Gait: Gait normal.     Deep Tendon Reflexes: Reflexes normal.  Psychiatric:        Mood and Affect: Mood normal.        Behavior: Behavior normal.        Thought Content: Thought content normal.        Judgment: Judgment normal.     LABS:      Latest Ref Rng & Units 06/11/2022   12:00 AM 04/24/2022   12:00 AM 03/28/2022   12:00 AM  CBC  WBC  4.6     4.8     4.9      Hemoglobin 12.0 - 16.0 12.1     11.9     11.3      Hematocrit 36 - 46 36      36     34      Platelets 150 - 400 K/uL 244     241     216         This result is from an external  source.      Latest Ref Rng & Units 06/11/2022   12:00 AM 04/24/2022   12:00 AM 06/11/2021   11:06 AM  CMP  Glucose 70 - 99 mg/dL   95   BUN 4 - '21 18     14     16   '$ Creatinine 0.5 - 1.1 0.8     0.7     0.47   Sodium 137 - 147 137     138     139   Potassium 3.5 - 5.1 mEq/L 3.8     4.0     4.2   Chloride 99 - 108 107     105     107   CO2 13 - '22 26     29     27   '$ Calcium 8.7 - 10.7 9.0     9.2     8.7   Alkaline Phos 25 - 125 82     105       AST 13 - 35 38     40       ALT 7 - 35 U/L 30     28          This result is from an external source.     ANC is 1.15  No results found for: "CEA1", "CEA" / No results found for: "CEA1", "CEA" No results found for: "PSA1" No results found for: "QQV956" No results found for: "CAN125"  No results found for: "TOTALPROTELP", "ALBUMINELP", "A1GS", "A2GS", "BETS", "BETA2SER", "GAMS", "MSPIKE", "SPEI" No results found for: "TIBC", "FERRITIN", "IRONPCTSAT" No results found for: "LDH"  STUDIES:     PATH REPORT DCIS 06/06/2022 Breast, lumpectomy, left mass, short superior, long lateral. Ductal carcinoma in situ, high grade, with necrosis and calcifications Resection margins are negative for DCIS; closest is the superior margin at 0.2 cm Biopsy site changes  HISTORY:   Past Medical History:  Diagnosis Date   Anemia    Arthritis    Rheumatoid and osteoarthritis   Cancer ([place])    skin,breast  right   Depression    Dysrhythmia    Afib   Hypertension    Hypothyroidism    Pre-diabetes    denies states she is no longer pre DM    Past Surgical History:  Procedure Laterality Date   BREAST SURGERY     CANCER   CHOLECYSTECTOMY     GASTRIC RESTRICTION SURGERY     HERNIA REPAIR     JOINT REPLACEMENT Right    KNEE   REVERSE SHOULDER ARTHROPLASTY Left 12/21/2020   Procedure: REVERSE SHOULDER ARTHROPLASTY;  Surgeon: Justice Britain, MD;  Location: [facility];  Service: Orthopedics;  Laterality: Left;  159mn   TONSILLECTOMY     TOTAL SHOULDER REVISION Left 06/12/2021   Procedure: Revision left reverse shoulder arthroplasty;  Surgeon: SJustice Britain MD;  Location: [facility];  Service: Orthopedics;  Laterality: Left;    Family History  Problem Relation Age of Onset   Arthritis Mother    Hypertension Mother    Cancer Father        CANCER    Social History:  reports that she has never smoked. She has never used smokeless tobacco. She reports that she does not drink alcohol and does not use drugs.The patient is alone today.  Allergies:  Allergies  Allergen Reactions   Infliximab Rash    Current Medications: Current Outpatient Medications  Medication Sig Dispense Refill   amiodarone (PACERONE)  200 MG tablet Take by mouth.     amLODipine (NORVASC) 5 MG tablet Take 5 mg by mouth daily.     apixaban (ELIQUIS) 5 MG TABS tablet Take 5 mg by mouth 2 (two) times daily.     atorvastatin (LIPITOR) 80 MG tablet Take 80 mg by mouth daily.     Biotin 10 MG TABS Take 10 mg by mouth every 7 (seven) days.     Calcium Carb-Cholecalciferol (CALCIUM 600 + D PO) Take 1 tablet by mouth daily.     docusate sodium (COLACE) 100 MG capsule Take 100 mg by mouth 2 (two) times daily.     furosemide (LASIX) 20 MG tablet Take 20 mg by mouth daily as needed for edema.     golimumab (SIMPONI ARIA) 50 MG/4ML SOLN injection Inject 50 mg into the vein every 8 (eight) weeks.     leflunomide (ARAVA) 20 MG tablet Take 20 mg by mouth daily.     levothyroxine (SYNTHROID) 25 MCG tablet Take 25 mcg by mouth daily before breakfast.     losartan (COZAAR) 100 MG tablet      metoprolol succinate (TOPROL-XL) 25 MG 24 hr tablet Take 25 mg by mouth daily.     Multiple Vitamin (MULTIVITAMIN ADULT) TABS Take 1 tablet by mouth daily.     ondansetron (ZOFRAN) 4 MG tablet Take 1 tablet (4 mg total) by mouth every 8 (eight) hours as needed for nausea or vomiting.  (Patient not taking: No sig reported) 10 tablet 0   PARoxetine (PAXIL) 30 MG tablet Take 30 mg by mouth every morning.     raloxifene (EVISTA) 60 MG tablet Take 1 tablet (60 mg total) by mouth daily. 30 tablet 5   rivaroxaban (XARELTO) 20 MG TABS tablet Take by mouth.     vitamin B-12 (CYANOCOBALAMIN) 1000 MCG tablet Take 1,000 mcg by mouth daily.     No current facility-administered medications for this visit.   ASSESSMENT & PLAN:   Assessment:  1. 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  She received adjuvant chemotherapy with CMF, followed by radiotherapy to the right breast, as well as letrozole 2.5 mg daily for 5 years.  She remains without evidence of disease.  2. Rheumatoid arthritis, for which she remains on golimumab.  3. Osteopenia, now osteoporosis.  She continues to take calcium and vitamin-D. Her bone density scan in November, 2023 showed worsening in the left hip and spine, which is now osteoporosis. The Raloxifene may give her some benefit but I would consider something more, as she is taking this for chemoprevention of breast cancer. I recommended that she should start taking medication and discussed Alendronate to improve her bone health. She should discuss this with her primary care provider and her Rheumatologist, Dr. Amil Amen. We discussed other alternatives with Prolia or Reclast.  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  This was diagnosed in September of 2023, and treated with lumpectomy. She is on chemoprevention of breast cancer with Raloxifene. This may also give her some benefit with her bones but I am using it for prevention of breast cancer.     Plan:   I will see her back in 4 months for reevaluation. She is taking Raloxifene without any difficulty.  She will discuss with her other physicians whether she should consider additional medication for her bones.  The patient and her husband  understand the plans discussed today and is in  agreement with them.  She knows to contact our office if she develops concerns prior to her next appointment.  I provided 15 minutes of face-to-face time during this this encounter and > 50% was spent counseling as documented under my assessment and plan.   Derwood Kaplan, MD    I,Jasmine M Lassiter,acting as a scribe for Derwood Kaplan, MD.,have documented all relevant documentation on the behalf of Derwood Kaplan, MD,as directed by  Derwood Kaplan, MD while in the presence of Derwood Kaplan, MD.

## 2022-09-11 ENCOUNTER — Telehealth: Payer: Self-pay | Admitting: Oncology

## 2022-09-11 ENCOUNTER — Inpatient Hospital Stay: Payer: Medicare Other | Attending: Oncology | Admitting: Oncology

## 2022-09-11 ENCOUNTER — Encounter: Payer: Self-pay | Admitting: Oncology

## 2022-09-11 VITALS — BP 130/81 | HR 111 | Temp 97.6°F | Resp 16 | Ht 63.0 in | Wt 162.4 lb

## 2022-09-11 DIAGNOSIS — D0512 Intraductal carcinoma in situ of left breast: Secondary | ICD-10-CM | POA: Diagnosis not present

## 2022-09-11 DIAGNOSIS — Z853 Personal history of malignant neoplasm of breast: Secondary | ICD-10-CM

## 2022-09-11 NOTE — Telephone Encounter (Signed)
08/1722 Next appt scheduled and confirmed with patient 

## 2022-09-11 NOTE — Progress Notes (Signed)
Face to face contact with pt and her husband in [place]. Pt is here for Med Onc follow up. Pt reports that she is doing well with her aromatase inhibitor. Denies any problems or side effects.

## 2022-11-12 IMAGING — CR DG SHOULDER 2+V*L*
3 series · 3 of 3 positions shown · non-contrast
Comparison: 11/22/2020, preoperative

CLINICAL DATA: LEFT shoulder pain for 5 months since joint
replacement surgery on 12/21/2020, numbness and tingling sensation
LEFT shoulder

EXAM:
LEFT SHOULDER - 2+ VIEW

[w shoulder external left]
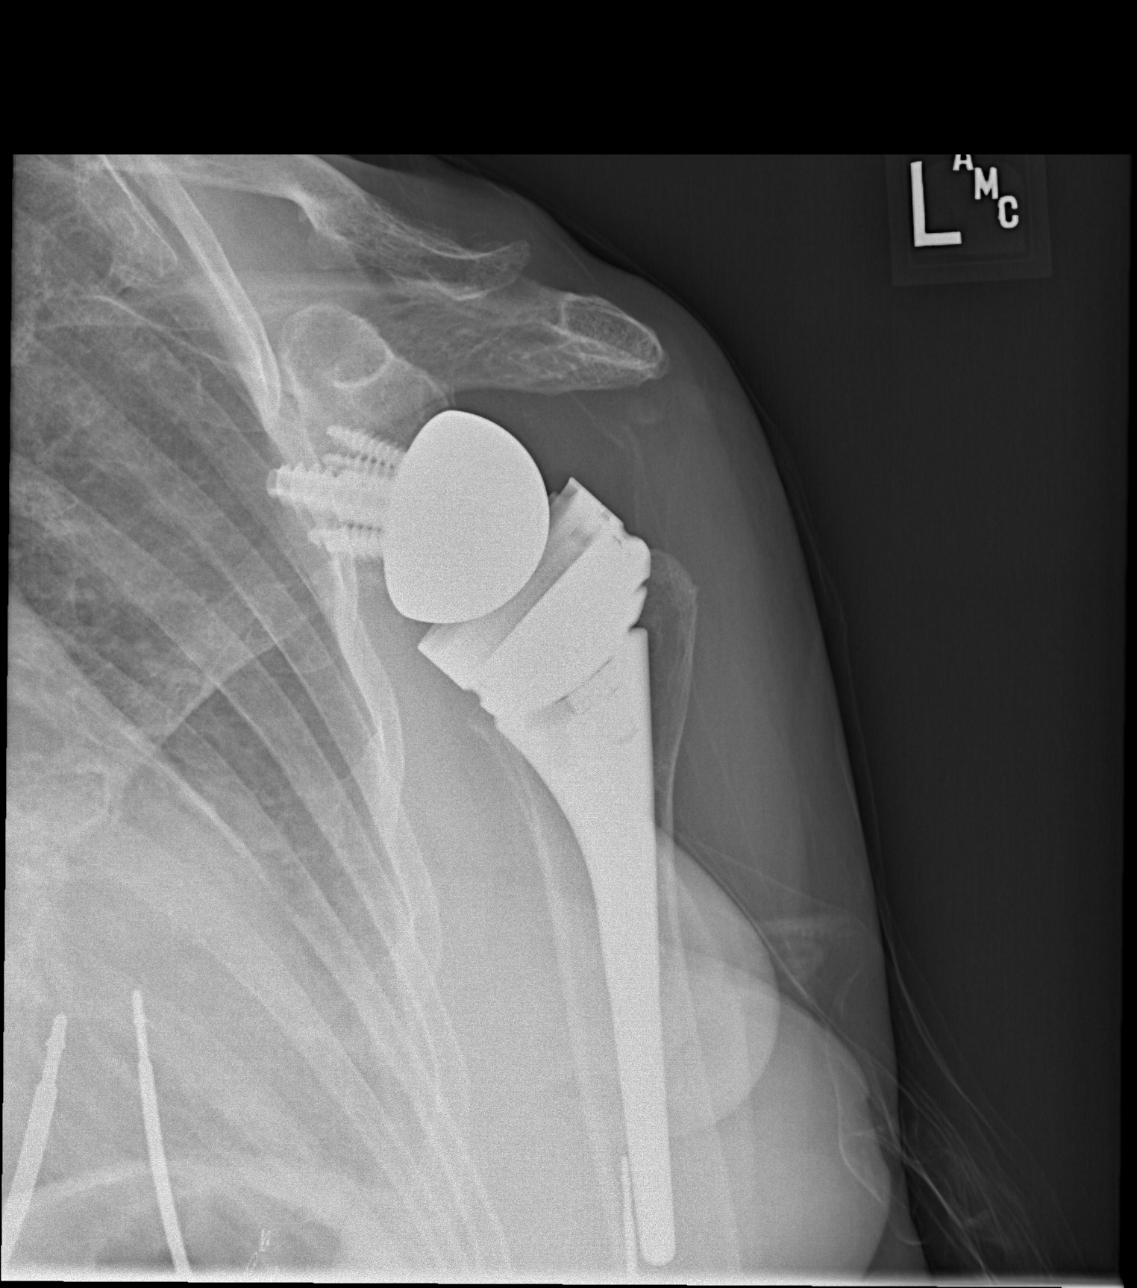

[w shoulder y-view left]
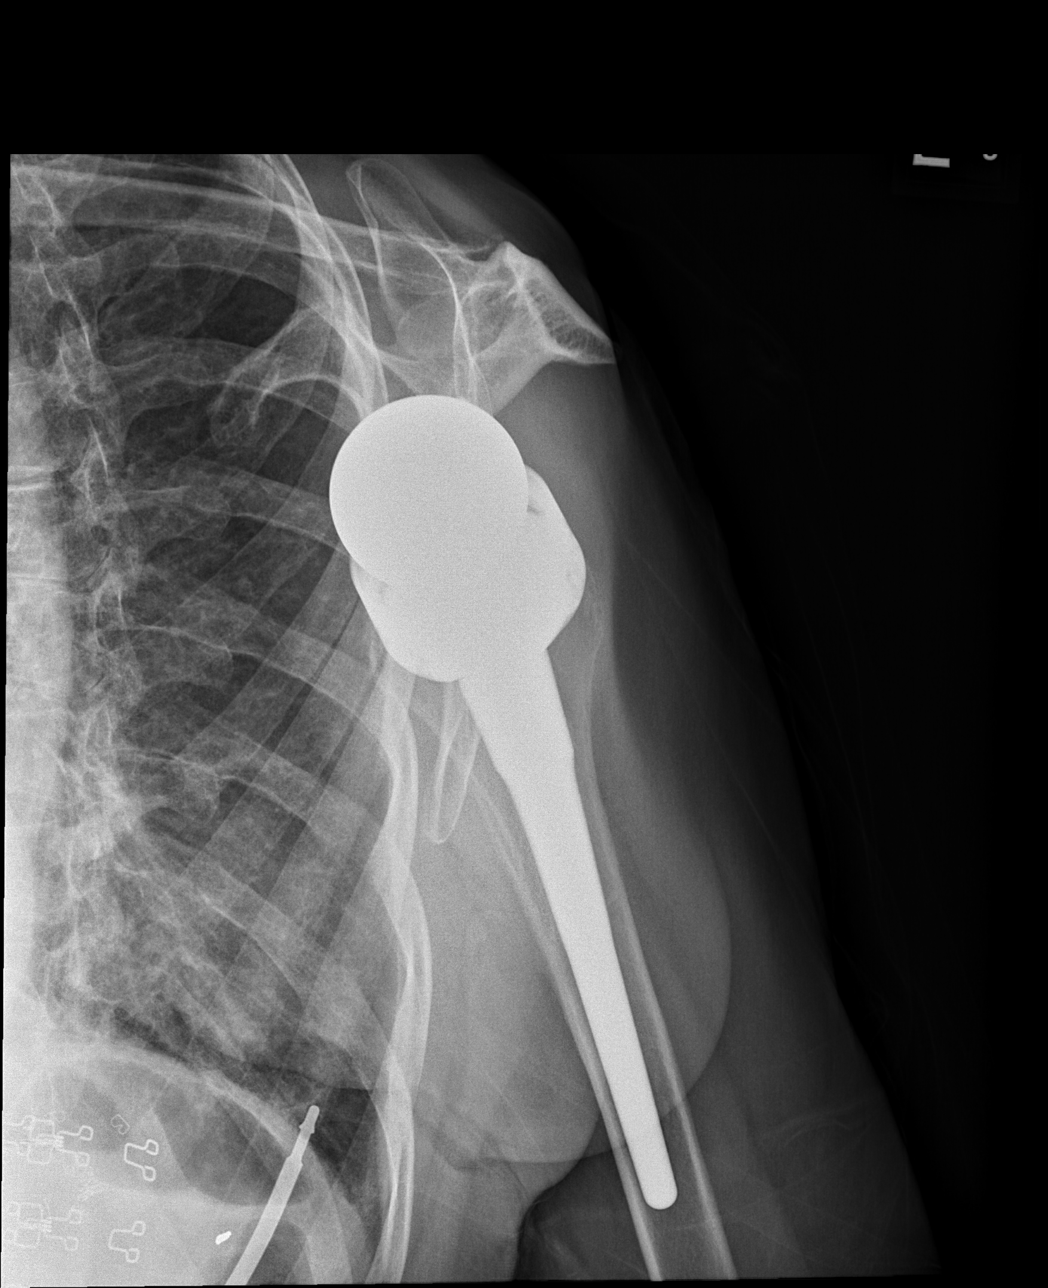

[x shoulder axillary left]
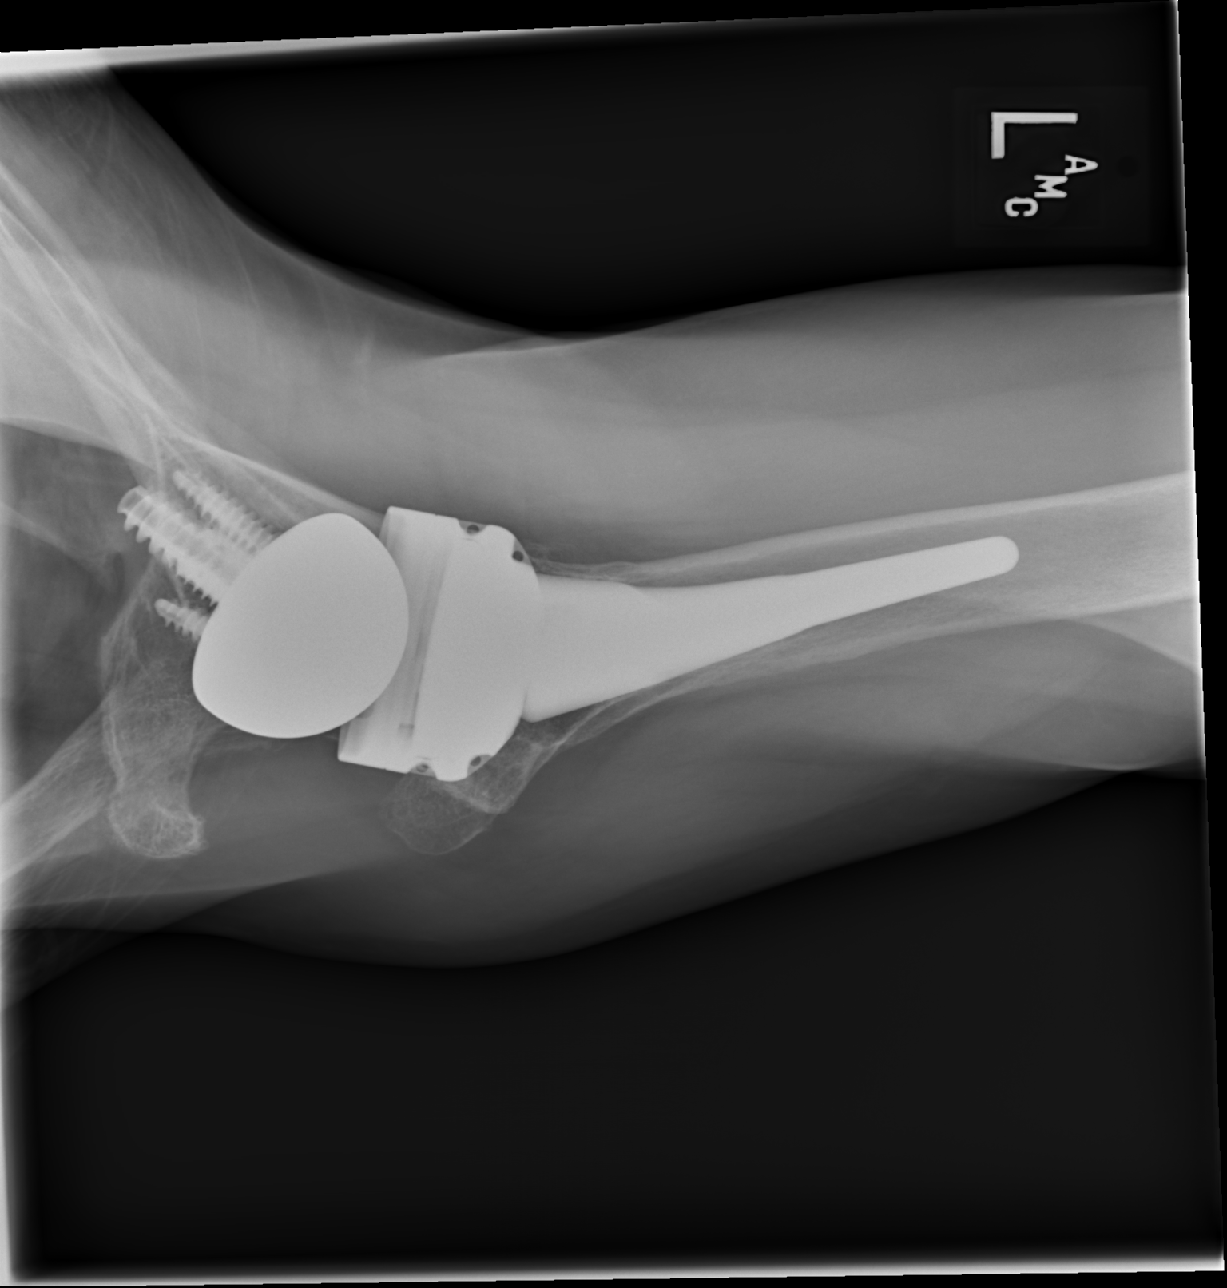

[3 of 3 positions shown; findings below may reference images not displayed]

FINDINGS: Components of LEFT shoulder reverse arthroplasty identified.

Osseous demineralization.

No acute fracture, dislocation, or bone destruction.

Visualized ribs unremarkable.

Suspect prior resection of distal LEFT clavicle.
IMPRESSION: No acute osseous abnormalities.

## 2023-01-09 NOTE — Progress Notes (Signed)
[facility]  [address] 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  She was treated with lumpectomy.  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  1 of 7 lymph nodes was positive for metastasis.  Estrogen and progesterone receptors were positive and her 2 Neu negative.  She received adjuvant chemotherapy with CMF, followed by radiotherapy to the right breast, as well as letrozole 2.5 mg daily for 5 years.  She has remained without evidence of recurrence.  Due to her personal and family history of cancer, we recommended she be tested for hereditary cancer syndromes.  The patient had the [employer] myRisk Hereditary Cancer Panel test last year and this was negative for any genetic mutations or variants of uncertain significance.  She follows up with her rheumatologist and continues to take Simponi Aria (golimumab) treatment for rheumatoid arthritis.  She is still on Eliquis for her atrial fibrillation.  She also has known osteopenia.  Also, he has had a previous splenectomy and cholecystectomy.  She states she does not know when her last colonoscopy was, but she states she was not advised to have another colonoscopy.  She was also hospitalized in 2019 with a small-bowel obstruction.  This resolved with bowel rest.  Review of her records revealed colonoscopy in October 2012 was negative except for small internal hemorrhoids.  CT abdomen and pelvis in November 2019, at the time of her hospitalization, revealed dilated loops of small bowel and  small volume ascites due to small-bowel obstruction, as well as mild biliary ductal dilatation, felt to be most likely secondary to previous cholecystectomy. Annual bilateral mammograms have remained without evidence of malignancy.  At her visit in July 2021, she had worsening anemia iron studies, B12 and folate were normal.  There was no evidence of hemolysis.   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 ER and PR were negative. However, we placed her on Raloxifene for chemoprevention since this is her second breast cancer.  Patient states that she feels well and only complains of sore legs from being in her garden. She continues Raloxifene  without difficulty. She informed me that her heart is now in A-fib and she has a follow-up appointment with a doctor in [facility]. Her next mammogram is scheduled on 03/26/2023 follow-up by a appointment with Dr. Lequita Halt. Her bone density scan on 06/27/2022 revealed osteopenia of the hip and osteoporosis of the spine with a T-score of -2.6 worse since 2020. Hopefully the Raloxifene will help her bones. She takes 1 Caltrate with vitamin D supplement a day and I instructed her to take 2 daily.  I will see her back in 6 months with CBC and CMP. She denies signs of infection such as sore throat, sinus drainage, cough, or urinary symptoms.  She denies fevers or recurrent chills. She denies pain. She denies nausea, vomiting, chest pain, dyspnea or cough. Her appetite is good and her weight has decreased 2 pounds over last 4 months . This patient is accompanied in the office by her  husband .  REVIEW OF SYSTEMS:  Review of Systems  Constitutional:  Negative for appetite change, chills, diaphoresis, fatigue, fever and unexpected weight change.  HENT:  Negative.  Negative for hearing loss, lump/mass, mouth sores, nosebleeds, sore throat, tinnitus, trouble swallowing and voice change.   Eyes: Negative.  Negative for eye problems and icterus.   Respiratory:  Negative for chest tightness, cough, hemoptysis, shortness of breath and wheezing.   Cardiovascular: Negative.  Negative for chest pain, leg swelling and palpitations.  Gastrointestinal: Negative.  Negative for abdominal distention, abdominal pain, blood in stool, constipation, diarrhea, nausea, rectal pain and vomiting.  Endocrine: Negative.  Negative for hot flashes.  Genitourinary: Negative.  Negative for bladder incontinence, difficulty urinating, dyspareunia, dysuria, frequency, hematuria, menstrual problem, nocturia, pelvic pain, vaginal bleeding and vaginal discharge.   Musculoskeletal:  Positive for arthralgias. Negative for back pain, flank pain, gait problem, myalgias, neck pain and neck stiffness.       Sore legs from gardening   Skin: Negative.  Negative for itching, rash and wound.  Neurological:  Negative for dizziness, extremity weakness, gait problem, headaches, light-headedness, numbness, seizures and speech difficulty.  Hematological: Negative.  Negative for adenopathy. Does not bruise/bleed easily.  Psychiatric/Behavioral: Negative.  Negative for confusion, decreased concentration, depression, sleep disturbance and suicidal ideas. The patient is not nervous/anxious.      VITALS:  Blood pressure 123/88, pulse 86, temperature (!) 97.3 F (36.3 C), temperature source Oral, resp. rate 18, height 5\' 3"  (1.6 m), weight 160 lb 12.8 oz (72.9 kg), SpO2 99 %.  Wt Readings from Last 3 Encounters:  01/14/23 160 lb 12.8 oz (72.9 kg)  09/11/22 162 lb 6.4 oz (73.7 kg)  06/11/22 162 lb 8 oz (73.7 kg)    Body mass index is 28.48 kg/m.  Performance status (ECOG): 1 - Symptomatic but completely ambulatory  PHYSICAL EXAM:  Physical Exam Vitals and nursing note reviewed.  Constitutional:      General: She is not in acute distress.    Appearance: Normal appearance. She is normal weight. She is not ill-appearing, toxic-appearing or diaphoretic.  HENT:     Head:  Normocephalic and atraumatic.     Right Ear: Tympanic membrane, ear canal and external ear normal. There is no impacted cerumen.     Left Ear: Tympanic membrane, ear canal and external ear normal. There is no impacted cerumen.     Nose: Nose normal. No congestion or rhinorrhea.     Mouth/Throat:     Mouth: Mucous membranes are moist.     Pharynx: Oropharynx is clear. No oropharyngeal exudate or posterior oropharyngeal erythema.  Eyes:     General: No scleral icterus.       Right eye: No discharge.        Left eye: No discharge.     Extraocular Movements: Extraocular movements intact.     Conjunctiva/sclera: Conjunctivae normal.     Pupils: Pupils are equal, round, and reactive to light.  Neck:     Vascular: No carotid bruit.  Cardiovascular:     Rate and Rhythm: Normal rate. Rhythm irregular.     Pulses: Normal pulses.     Heart sounds: Normal heart sounds. No murmur heard.    No friction rub. No gallop.  Pulmonary:     Effort: Pulmonary effort is normal. No respiratory distress.     Breath sounds: Normal breath sounds. No stridor. No wheezing, rhonchi or rales.     Comments: In the left base Chest:  Chest wall: No tenderness.  Breasts:    Right: Normal. No swelling, bleeding, inverted nipple, mass, nipple discharge, skin change or tenderness.     Left: Normal. No swelling, bleeding, inverted nipple, mass, nipple discharge, skin change or tenderness.     Comments: Well healed scar in the far lateral left breast. Right axillary scar which is well healed. Faded scar in the lower outer quadrant of the right breast Mild fibrocystic changes in both breast.  No masses in either breast. Abdominal:     General: Bowel sounds are normal. There is no distension.     Palpations: Abdomen is soft. There is no hepatomegaly, splenomegaly or mass.     Tenderness: There is no abdominal tenderness. There is no right CVA tenderness, left CVA tenderness, guarding or rebound.     Hernia: No  hernia is present.     Comments: Large midline abdominal scar which is well healed.   Musculoskeletal:        General: Deformity present. No swelling, tenderness or signs of injury. Normal range of motion.     Right hand: Deformity (severe rheumatoid changes) present.     Left hand: Deformity (severe rheumatoid changes) present.     Cervical back: Normal range of motion and neck supple. No rigidity or tenderness.     Right lower leg: No edema.     Left lower leg: No edema.     Comments: Large scars of both shoulders.  Lymphadenopathy:     Cervical: No cervical adenopathy.     Upper Body:     Right upper body: No supraclavicular or axillary adenopathy.     Left upper body: No supraclavicular or axillary adenopathy.     Lower Body: No right inguinal adenopathy. No left inguinal adenopathy.  Skin:    General: Skin is warm and dry.     Coloration: Skin is not jaundiced or pale.     Findings: No bruising, erythema, lesion or rash.  Neurological:     General: No focal deficit present.     Mental Status: She is alert and oriented to person, place, and time. Mental status is at baseline.     Cranial Nerves: No cranial nerve deficit.     Sensory: No sensory deficit.     Motor: No weakness.     Coordination: Coordination normal.     Gait: Gait normal.     Deep Tendon Reflexes: Reflexes normal.  Psychiatric:        Mood and Affect: Mood normal.        Behavior: Behavior normal.        Thought Content: Thought content normal.        Judgment: Judgment normal.    LABS:   Component Ref Range & Units 12/11/2022  WBC 4.40 - 11.00 10*3/uL 4.50  RBC 4.10 - 5.10 10*6/uL 3.65 Low   Hemoglobin 12.3 - 15.3 g/dL 40.9 Low   Hematocrit 81.1 - 44.6 % 35.5 Low   Mean Corpuscular Volume (MCV) 80.0 - 96.0 fL 97.5 High   Mean Corpuscular Hemoglobin (MCH) 27.5 - 33.2 pg 32.9  Mean Corpuscular Hemoglobin Conc (MCHC) 33.0 - 37.0 g/dL 91.4  Red Cell Distribution Width (RDW) 12.3 - 17.0 % 15.6   Platelet Count (PLT) 150 - 450 10*3/uL 298   Component Ref Range & Units 12/11/2022  Sodium 136 - 145 mmol/L 139  Potassium 3.5 - 5.1 mmol/L 4.4  Chloride 98 - 107 mmol/L 106  CO2 21 - 31 mmol/L 27  Anion Gap 6 - 14 mmol/L 6  Glucose, Random 70 - 99 mg/dL 91  Blood Urea Nitrogen (BUN) 7 - 25 mg/dL 16  Creatinine 1.61 - 0.96 mg/dL 0.45  eGFR >40 JW/JXB/1.47W2 75  Albumin 3.5 - 5.7 g/dL 3.7  Total Protein 6.4 - 8.9 g/dL 6.4  Bilirubin, Total 0.3 - 1.0 mg/dL 0.6  Alkaline Phosphatase (ALP) 34 - 104 U/L 77  Aspartate Aminotransferase (AST) 13 - 39 U/L 25  Alanine Aminotransferase (ALT) 7 - 52 U/L 19  Calcium 8.6 - 10.3 mg/dL 8.6   Component Ref Range & Units 12/11/2022  Amiodarone 1000 - 2500 ng/mL 325 Low   Desethylamiodarone ng/mL 346   Component Ref Range & Units 12/11/2022  TSH 0.450 - 5.330 uIU/mL 2.177    Component Ref Range & Units 09/17/2022  Magnesium 1.8 - 2.4 MG/DL 2.2      Latest Ref Rng & Units 06/11/2022   12:00 AM 04/24/2022   12:00 AM 03/28/2022   12:00 AM  CBC  WBC  4.6     4.8     4.9      Hemoglobin 12.0 - 16.0 12.1     11.9     11.3      Hematocrit 36 - 46 36     36     34      Platelets 150 - 400 K/uL 244     241     216         This result is from an external source.      Latest Ref Rng & Units 06/11/2022   12:00 AM 04/24/2022   12:00 AM 06/11/2021   11:06 AM  CMP  Glucose 70 - 99 mg/dL   95   BUN 4 - 21 18     14     16    Creatinine 0.5 - 1.1 0.8     0.7     0.47   Sodium 137 - 147 137     138     139   Potassium 3.5 - 5.1 mEq/L 3.8     4.0     4.2   Chloride 99 - 108 107     105     107   CO2 13 - 22 26     29     27    Calcium 8.7 - 10.7 9.0     9.2     8.7   Alkaline Phos 25 - 125 82     105       AST 13 - 35 38     40       ALT 7 - 35 U/L 30     28          This result is from an external source.    STUDIES:      HISTORY:   Past Medical History:  Diagnosis Date   Anemia    Arthritis    Rheumatoid  and osteoarthritis   Cancer (HCC)    skin,breast  right   Depression    Dysrhythmia    Afib   Hypertension    Hypothyroidism    Pre-diabetes    denies states she is no longer pre DM    Past Surgical History:  Procedure Laterality Date   BREAST SURGERY     CANCER   CHOLECYSTECTOMY     GASTRIC RESTRICTION SURGERY     HERNIA REPAIR     JOINT REPLACEMENT Right  KNEE   REVERSE SHOULDER ARTHROPLASTY Left 12/21/2020   Procedure: REVERSE SHOULDER ARTHROPLASTY;  Surgeon: Francena Hanly, MD;  Location: [facility];  Service: Orthopedics;  Laterality: Left;    TONSILLECTOMY     TOTAL SHOULDER REVISION Left 06/12/2021   Procedure: Revision left reverse shoulder arthroplasty;  Surgeon: Francena Hanly, MD;  Location: [facility];  Service: Orthopedics;  Laterality: Left;    Family History  Problem Relation Age of Onset   Arthritis Mother    Hypertension Mother    Cancer Father        CANCER    Social History:  reports that she has never smoked. She has never used smokeless tobacco. She reports that she does not drink alcohol and does not use drugs.The patient is alone today.  Allergies:  Allergies  Allergen Reactions   Atorvastatin Other (See Comments)    At 80 mg (?)   Infliximab Rash    Current Medications: Current Outpatient Medications  Medication Sig Dispense Refill   amiodarone (PACERONE) 400 MG tablet 1 tablet Orally Once a day     amLODipine (NORVASC) 5 MG tablet Take 5 mg by mouth daily.     apixaban (ELIQUIS) 5 MG TABS tablet Take 5 mg by mouth 2 (two) times daily.     atorvastatin (LIPITOR) 80 MG tablet Take 80 mg by mouth daily.     Biotin 10 MG TABS Take 10 mg by mouth every 7 (seven) days.     Calcium Carb-Cholecalciferol (CALCIUM 600 + D PO) Take 1 tablet by mouth daily.     docusate sodium (COLACE) 100 MG capsule Take 100 mg by mouth 2 (two) times daily.     furosemide (LASIX) 20 MG tablet Take 20 mg by mouth daily as needed for edema.     golimumab (SIMPONI  ARIA) 50 MG/4ML SOLN injection Inject 50 mg into the vein every 8 (eight) weeks.     leflunomide (ARAVA) 20 MG tablet Take 20 mg by mouth daily.     levothyroxine (SYNTHROID) 25 MCG tablet Take 25 mcg by mouth daily before breakfast.     losartan (COZAAR) 100 MG tablet      metoprolol succinate (TOPROL-XL) 25 MG 24 hr tablet Take 25 mg by mouth daily.     Multiple Vitamin (MULTIVITAMIN ADULT) TABS Take 1 tablet by mouth daily.     ondansetron (ZOFRAN) 4 MG tablet Take 1 tablet (4 mg total) by mouth every 8 (eight) hours as needed for nausea or vomiting. (Patient not taking: No sig reported) 10 tablet 0   PARoxetine (PAXIL) 30 MG tablet Take 30 mg by mouth every morning.     raloxifene (EVISTA) 60 MG tablet Take 1 tablet (60 mg total) by mouth daily. 30 tablet 5   rivaroxaban (XARELTO) 20 MG TABS tablet Take by mouth.     vitamin B-12 (CYANOCOBALAMIN) 1000 MCG tablet Take 1,000 mcg by mouth daily.     No current facility-administered medications for this visit.   ASSESSMENT & PLAN:  Assessment:  1. 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  She received adjuvant chemotherapy with CMF, followed by radiotherapy to the right breast, as well as letrozole 2.5 mg daily for 5 years.  She remains without evidence of disease.  2. Rheumatoid arthritis, for which she remains on golimumab.  3. Osteopenia, now osteoporosis.  She continues to take calcium and vitamin-D. Her bone density scan in November, 2023 showed worsening in the left hip and spine, which is now  osteoporosis. The Raloxifene may give her some benefit but I would consider something more, as she is taking this for chemoprevention of breast cancer. I recommended that she might need to take additional medication to improve her bone health, and discussed alendronate. She should discuss this with her primary care provider and her Rheumatologist, Dr. Dierdre Forth. We discussed other alternatives with Prolia or Reclast. She continues Raloxifene  without any difficulty, started in October, 2023.   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  This was diagnosed in September of 2023, and treated with lumpectomy. She is on chemoprevention of breast cancer with Raloxifene. This may also give her some benefit with her bones but has only been on it for 6 months, and I am using it for prevention of breast cancer.    Plan:   She continues Raloxifene  without difficulty. She informed me that her heart is now in A-fib and she has a follow-up appointment with a doctor in [facility]. Her next mammogram is scheduled on 03/26/2023 follow-up by a appointment with Dr. Lequita Halt. Her bone density scan on 06/27/2022 revealed osteopenia of the hip and osteoporosis of the spine with a T-score of -2.6 worse since 2020. Hopefully the Raloxifene will help her bones. She takes 1 Caltrate with vitamin D supplement a day and I instructed her to take 2 daily.  I will see her back in 6 months with CBC and CMP. The patient and her husband understand the plans discussed today and is in agreement with them.  She knows to contact our office if she develops concerns prior to her next appointment.  I provided 30 minutes of face-to-face time during this this encounter and > 50% was spent counseling as documented under my assessment and plan.   Dellia Beckwith, MD    I,Jasmine M Lassiter,acting as a scribe for Dellia Beckwith, MD.,have documented all relevant documentation on the behalf of Dellia Beckwith, MD,as directed by  Dellia Beckwith, MD while in the presence of Dellia Beckwith, MD.

## 2023-01-10 ENCOUNTER — Ambulatory Visit: Payer: Medicare Other | Admitting: Oncology

## 2023-01-14 ENCOUNTER — Other Ambulatory Visit: Payer: Self-pay | Admitting: Oncology

## 2023-01-14 ENCOUNTER — Encounter: Payer: Self-pay | Admitting: Oncology

## 2023-01-14 ENCOUNTER — Inpatient Hospital Stay: Payer: Medicare Other | Attending: Oncology | Admitting: Oncology

## 2023-01-14 VITALS — BP 123/88 | HR 86 | Temp 97.3°F | Resp 18 | Ht 63.0 in | Wt 160.8 lb

## 2023-01-14 DIAGNOSIS — D0512 Intraductal carcinoma in situ of left breast: Secondary | ICD-10-CM | POA: Diagnosis not present

## 2023-01-16 ENCOUNTER — Telehealth: Payer: Self-pay | Admitting: Oncology

## 2023-01-16 NOTE — Telephone Encounter (Signed)
Patient has been scheduled. Aware of appt date and time.  Scheduling Message Entered by Gery Pray H on 01/14/2023 at 10:43 AM Priority: Routine EST PT 30  Department: [facility]  Provider:  Scheduling Notes:  RT 6 months with labs

## 2023-03-31 ENCOUNTER — Other Ambulatory Visit: Payer: Medicare Other

## 2023-03-31 ENCOUNTER — Ambulatory Visit: Payer: Medicare Other | Admitting: Oncology

## 2023-04-01 ENCOUNTER — Ambulatory Visit: Payer: Medicare Other | Admitting: Oncology

## 2023-04-01 ENCOUNTER — Other Ambulatory Visit: Payer: Medicare Other

## 2023-07-15 ENCOUNTER — Encounter: Payer: Self-pay | Admitting: Hematology and Oncology

## 2023-07-15 ENCOUNTER — Other Ambulatory Visit: Payer: Medicare Other

## 2023-07-15 ENCOUNTER — Telehealth: Payer: Self-pay | Admitting: Hematology and Oncology

## 2023-07-15 ENCOUNTER — Inpatient Hospital Stay (HOSPITAL_BASED_OUTPATIENT_CLINIC_OR_DEPARTMENT_OTHER): Payer: Medicare Other | Admitting: Hematology and Oncology

## 2023-07-15 ENCOUNTER — Ambulatory Visit: Payer: Medicare Other | Admitting: Oncology

## 2023-07-15 ENCOUNTER — Inpatient Hospital Stay: Payer: Medicare Other | Attending: Hematology and Oncology

## 2023-07-15 VITALS — BP 151/69 | HR 50 | Temp 97.7°F | Resp 20 | Ht 62.0 in | Wt 162.3 lb

## 2023-07-15 DIAGNOSIS — Z9081 Acquired absence of spleen: Secondary | ICD-10-CM | POA: Insufficient documentation

## 2023-07-15 DIAGNOSIS — Z79899 Other long term (current) drug therapy: Secondary | ICD-10-CM | POA: Insufficient documentation

## 2023-07-15 DIAGNOSIS — M069 Rheumatoid arthritis, unspecified: Secondary | ICD-10-CM | POA: Insufficient documentation

## 2023-07-15 DIAGNOSIS — Z7901 Long term (current) use of anticoagulants: Secondary | ICD-10-CM | POA: Insufficient documentation

## 2023-07-15 DIAGNOSIS — I4891 Unspecified atrial fibrillation: Secondary | ICD-10-CM | POA: Diagnosis not present

## 2023-07-15 DIAGNOSIS — D0512 Intraductal carcinoma in situ of left breast: Secondary | ICD-10-CM | POA: Diagnosis not present

## 2023-07-15 DIAGNOSIS — D509 Iron deficiency anemia, unspecified: Secondary | ICD-10-CM | POA: Insufficient documentation

## 2023-07-15 DIAGNOSIS — D649 Anemia, unspecified: Secondary | ICD-10-CM

## 2023-07-15 DIAGNOSIS — M810 Age-related osteoporosis without current pathological fracture: Secondary | ICD-10-CM

## 2023-07-15 DIAGNOSIS — Z809 Family history of malignant neoplasm, unspecified: Secondary | ICD-10-CM

## 2023-07-15 LAB — CBC WITH DIFFERENTIAL (CANCER CENTER ONLY)
Abs Immature Granulocytes: 0.02 10*3/uL (ref 0.00–0.07)
Basophils Absolute: 0.1 10*3/uL (ref 0.0–0.1)
Basophils Relative: 1 %
Eosinophils Absolute: 0.1 10*3/uL (ref 0.0–0.5)
Eosinophils Relative: 3 %
HCT: 31.3 % — ABNORMAL LOW (ref 36.0–46.0)
Hemoglobin: 10.4 g/dL — ABNORMAL LOW (ref 12.0–15.0)
Immature Granulocytes: 0 %
Lymphocytes Relative: 41 %
Lymphs Abs: 2.2 10*3/uL (ref 0.7–4.0)
MCH: 31.5 pg (ref 26.0–34.0)
MCHC: 33.2 g/dL (ref 30.0–36.0)
MCV: 94.8 fL (ref 80.0–100.0)
Monocytes Absolute: 0.7 10*3/uL (ref 0.1–1.0)
Monocytes Relative: 13 %
Neutro Abs: 2.2 10*3/uL (ref 1.7–7.7)
Neutrophils Relative %: 42 %
Platelet Count: 281 10*3/uL (ref 150–400)
RBC: 3.3 MIL/uL — ABNORMAL LOW (ref 3.87–5.11)
RDW: 13.2 % (ref 11.5–15.5)
WBC Count: 5.4 10*3/uL (ref 4.0–10.5)
nRBC: 0 % (ref 0.0–0.2)
nRBC: 0 /100{WBCs}

## 2023-07-15 LAB — CMP (CANCER CENTER ONLY)
ALT: 15 U/L (ref 0–44)
AST: 21 U/L (ref 15–41)
Albumin: 3.7 g/dL (ref 3.5–5.0)
Alkaline Phosphatase: 98 U/L (ref 38–126)
Anion gap: 10 (ref 5–15)
BUN: 22 mg/dL (ref 8–23)
CO2: 26 mmol/L (ref 22–32)
Calcium: 9 mg/dL (ref 8.9–10.3)
Chloride: 106 mmol/L (ref 98–111)
Creatinine: 0.97 mg/dL (ref 0.44–1.00)
GFR, Estimated: 60 mL/min — ABNORMAL LOW (ref >60)
Glucose, Bld: 92 mg/dL (ref 70–99)
Potassium: 3.8 mmol/L (ref 3.5–5.1)
Sodium: 142 mmol/L (ref 135–145)
Total Bilirubin: 0.4 mg/dL (ref <1.2)
Total Protein: 6.4 g/dL — ABNORMAL LOW (ref 6.5–8.1)

## 2023-07-15 LAB — IRON AND TIBC
Iron: 27 ug/dL — ABNORMAL LOW (ref 28–170)
Saturation Ratios: 6 % — ABNORMAL LOW (ref 10.4–31.8)
TIBC: 444 ug/dL (ref 250–450)
UIBC: 417 ug/dL

## 2023-07-15 LAB — FOLATE: Folate: 21.3 ng/mL (ref >5.9)

## 2023-07-15 LAB — FERRITIN: Ferritin: 8 ng/mL — ABNORMAL LOW (ref 11–307)

## 2023-07-15 NOTE — Assessment & Plan Note (Signed)
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  This was discovered by calcifications in the upper outer quardant on mammogram.  She was treated with lumpectomy.  She was placed on chemoprevention of breast cancer with raloxifene in October 2023. This may also give her some benefit with her bones.  Bilateral diagnostic mammogram in July did not reveal any evidence of malignancy.  She will continue raloxifene daily.

## 2023-07-15 NOTE — Telephone Encounter (Signed)
Patient has been scheduled for follow-up visit per 07/15/23 LOS.  Pt given an appt calendar with date and time.

## 2023-07-15 NOTE — Progress Notes (Signed)
[facility]  [address] [place],  [place]  [address] [phone]  Clinic Day:  07/15/2023  Referring physician: Olive Bass, MD  ASSESSMENT & PLAN:   Assessment & Plan: Anemia History of mild chronic anemia, with recent worsening.  She denies any overt blood loss, but is on rivaroxaban.  Hemoglobin on discharge September was 9.1.  Evaluation in September at her PCP office did not reveal iron or B12 deficiency or evidence of hemolysis.  She remains on B12 oral supplementation, as well as multivitamin.  Ferritin done today was low at 8 consistent with iron deficiency. I recommend IV iron and discussed the potential adverse effects including the possibility of serious allergic reaction, as well as more common side effects of headaches, myalgias/arthragias, flushing, nausea, and irritation at the injection site. I would also recommend she see Dr. Jennye Boroughs to consider EGD and colonoscopy, so will refer back to him.  I will plan to see her back in 6 weeks with a CBC, CMP, iron/TIBC and ferritin to assess her response to treatment.    The patient understands the plans discussed today and is in agreement with them.  She knows to contact our office if she develops concerns prior to her next appointment.   60 minutes was spent in patient care.  This included time spent preparing to see the patient (e.g., review of tests), obtaining and/or reviewing separately obtained history, counseling and educating the patient/family/caregiver, ordering medications, tests, or procedures; documenting clinical information in the electronic or other health record, independently interpreting results and communicating results to the patient/family/caregiver as well as coordination of care.      Adah Perl, PA-C  [facility] DEPT OF [facility] [address] 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  She was treated with lumpectomy.  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  1 of 7 lymph nodes was positive for metastasis.  Estrogen and progesterone receptors were positive and her 2 Neu negative.  She received adjuvant chemotherapy with CMF, followed by radiotherapy to the right breast, as well as letrozole 2.5 mg daily for 5 years.  She has remained without evidence of recurrence.  Due to her personal and family history of cancer, we recommended she be tested for hereditary cancer syndromes.  The patient had the [employer] myRisk Hereditary Cancer Panel test last year and this was negative for any genetic mutations or variants of uncertain significance.  Annual bilateral mammogram had remained without evidence  of malignancy.  She has had chronic anemia dating back to at least 2010.  Evaluation including nutritional studies, SPEP and haptoglobin did not reveal a specific etiology.  She does not know when her last colonoscopy was, but she states she was not advised to have another colonoscopy.  She was hospitalized in 2019 with a small-bowel obstruction.  This resolved with bowel rest. CT abdomen and pelvis in November 2019, at the time of her hospitalization,  revealed dilated loops of small bowel and small volume ascites due to small-bowel obstruction, as well as mild biliary ductal dilatation, felt to be most likely secondary to previous cholecystectomy.  Review of her records revealed colonoscopy in October 2012 was negative except for small internal hemorrhoids.  She is seen by rheumatology and has been on Simponi Aria (golimumab) treatment for rheumatoid arthritis.  She has a history of atrial fibrillation and had been on apixaban.  She also has known osteopenia.  She is status post splenectomy and cholecystectomy.    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  She was treated with lumpectomy.  We placed her on raloxifene for chemoprevention in October 2023.  Bone density scan in November 2023 revealed osteoporosis a T-score of -2.6 in the spine and a T-score of -2.3 in the left femur.  She continues calcium and vitamin D daily.  The raloxifene may help her bones as well.  Dr. Gilman Buttner discussed treatment of the osteoporosis with Prolia or Reclast, but the patient decided against it.   Oncology History  Ductal carcinoma in situ (DCIS) of left breast  04/23/2022 Initial Diagnosis   Ductal carcinoma in situ (DCIS) of left breast   06/06/2022 Cancer Staging   Staging form: Breast, AJCC 8th Edition - Clinical stage from 06/06/2022: Stage 0 (cTis (DCIS), cN0, cM0, G3, ER-, PR-, HER2: Not Assessed) - Signed by Dellia Beckwith, MD on 07/04/2022 Histopathologic type: Intraductal carcinoma, noninfiltrating, NOS Method of lymph node assessment: Clinical Nuclear grade: G3 Multigene prognostic tests performed: None Histologic grading system: 3 grade system Laterality: Left Tumor size (mm): 11 Lymph-vascular invasion (LVI): LVI not present (absent)/not identified Diagnostic confirmation: Positive histology Specimen type: Excision Staged by: Managing physician Menopausal status: Postmenopausal Stage used in treatment  planning: Yes National guidelines used in treatment planning: Yes Type of national guideline used in treatment planning: NCCN       INTERVAL HISTORY:  Deborah Frazier is here today for 59-month follow-up.  She was hospitalized for observation in September when she presented to [facility] ER with a syncopal episode.  She was found to have a hemoglobin of 9.8.  This dropped to 9.1 with hydration.  EKG revealed normal sinus rhythm.  CT head did not reveal any acute abnormality.  Age-related changes were seen.  Chest x-ray revealed mild cardiomegaly, no acute cardiopulmonary process.  She denies fevers or chills. She has chronic joint pain, but increased in her knees.  This affects her gait at times.. Her appetite is good. Her weight has increased 2 pounds over last 6 months .  REVIEW OF SYSTEMS:  Review of Systems  Constitutional:  Negative for appetite change, chills, fatigue, fever and unexpected weight change.  HENT:   Negative for lump/mass, mouth sores, nosebleeds and sore throat.   Respiratory:  Negative for cough, hemoptysis and shortness of breath.   Cardiovascular:  Negative for chest pain and leg swelling.  Gastrointestinal:  Negative for abdominal pain, blood in stool, constipation, diarrhea, nausea and vomiting.  Endocrine: Negative  for hot flashes.  Genitourinary:  Negative for difficulty urinating, dysuria, frequency, hematuria, vaginal bleeding and vaginal discharge.   Musculoskeletal:  Positive for arthralgias and gait problem (due to knee pain). Negative for back pain and myalgias.  Skin:  Negative for itching and rash.  Neurological:  Positive for gait problem (due to knee pain). Negative for dizziness, extremity weakness, headaches, light-headedness and numbness.  Hematological:  Negative for adenopathy. Does not bruise/bleed easily.  Psychiatric/Behavioral:  Negative for depression and sleep disturbance. The patient is not nervous/anxious.      VITALS:  Blood pressure (!)  151/69, pulse (!) 50, temperature 97.7 F (36.5 C), temperature source Oral, resp. rate 20, height 5\' 2"  (1.575 m), weight 162 lb 4.8 oz (73.6 kg), SpO2 99%.  Wt Readings from Last 3 Encounters:  07/15/23 162 lb 4.8 oz (73.6 kg)  01/14/23 160 lb 12.8 oz (72.9 kg)  09/11/22 162 lb 6.4 oz (73.7 kg)    Body mass index is 29.69 kg/m.  Performance status (ECOG): 2 - Symptomatic, <50% confined to bed  PHYSICAL EXAM:  Physical Exam Vitals and nursing note reviewed.  Constitutional:      General: She is not in acute distress.    Appearance: Normal appearance.  HENT:     Head: Normocephalic and atraumatic.     Mouth/Throat:     Mouth: Mucous membranes are moist.     Pharynx: Oropharynx is clear. No oropharyngeal exudate or posterior oropharyngeal erythema.  Eyes:     General: No scleral icterus.    Extraocular Movements: Extraocular movements intact.     Conjunctiva/sclera: Conjunctivae normal.     Pupils: Pupils are equal, round, and reactive to light.  Cardiovascular:     Rate and Rhythm: Normal rate and regular rhythm.     Heart sounds: Normal heart sounds. No murmur heard.    No friction rub. No gallop.  Pulmonary:     Effort: Pulmonary effort is normal.     Breath sounds: Normal breath sounds. No wheezing, rhonchi or rales.  Chest:  Breasts:    Right: Normal. No inverted nipple, mass, nipple discharge or skin change.     Left: Normal. No inverted nipple, mass, nipple discharge or skin change.  Abdominal:     General: There is no distension.     Palpations: Abdomen is soft. There is no mass.     Tenderness: There is no abdominal tenderness.  Musculoskeletal:        General: Normal range of motion.     Cervical back: Normal range of motion and neck supple. No tenderness.     Right lower leg: No edema.     Left lower leg: No edema.  Lymphadenopathy:     Cervical: No cervical adenopathy.  Skin:    General: Skin is warm and dry.     Coloration: Skin is not jaundiced.      Findings: No rash.  Neurological:     Mental Status: She is alert and oriented to person, place, and time.     Cranial Nerves: No cranial nerve deficit.  Psychiatric:        Mood and Affect: Mood normal.        Behavior: Behavior normal.        Thought Content: Thought content normal.    LABS:      Latest Ref Rng & Units 07/15/2023    8:05 AM 06/11/2022   12:00 AM 04/24/2022   12:00 AM  CBC  WBC 4.0 - 10.5  K/uL 5.4  4.6     4.8      Hemoglobin 12.0 - 15.0 g/dL 16.1  09.6     04.5      Hematocrit 36.0 - 46.0 % 31.3  36     36      Platelets 150 - 400 K/uL 281  244     241         This result is from an external source.      Latest Ref Rng & Units 07/15/2023    8:05 AM 06/11/2022   12:00 AM 04/24/2022   12:00 AM  CMP  Glucose 70 - 99 mg/dL 92     BUN 8 - 23 mg/dL 22  18     14       Creatinine 0.44 - 1.00 mg/dL 4.09  0.8     0.7      Sodium 135 - 145 mmol/L 142  137     138      Potassium 3.5 - 5.1 mmol/L 3.8  3.8     4.0      Chloride 98 - 111 mmol/L 106  107     105      CO2 22 - 32 mmol/L 26  26     29       Calcium 8.9 - 10.3 mg/dL 9.0  9.0     9.2      Total Protein 6.5 - 8.1 g/dL 6.4     Total Bilirubin <1.2 mg/dL 0.4     Alkaline Phos 38 - 126 U/L 98  82     105      AST 15 - 41 U/L 21  38     40      ALT 0 - 44 U/L 15  30     28          This result is from an external source.     No results found for: "CEA1", "CEA" / No results found for: "CEA1", "CEA" No results found for: "PSA1" No results found for: "WJX914" No results found for: "CAN125"  No results found for: "TOTALPROTELP", "ALBUMINELP", "A1GS", "A2GS", "BETS", "BETA2SER", "GAMS", "MSPIKE", "SPEI" Lab Results  Component Value Date   TIBC 444 07/15/2023   FERRITIN 8 (L) 07/15/2023   IRONPCTSAT 6 (L) 07/15/2023   No results found for: "LDH"  STUDIES:    Exam(s): [id number] MAM/MAM DIGITAL W/TOMO DIAG B  CLINICAL DATA: 77 year old female for annual follow-up. History of  LEFT breast cancer and  lumpectomy in 2023.  EXAM:  DIGITAL DIAGNOSTIC BILATERAL MAMMOGRAM WITH TOMOSYNTHESIS AND CAD  TECHNIQUE:  Bilateral digital diagnostic mammography and breast tomosynthesis  was performed. The images were evaluated with computer-aided  detection.  COMPARISON: Previous exam(s).  ACR Breast Density Category b: There are scattered areas of  fibroglandular density.  FINDINGS:  2D and 3D full field views of both breasts and a magnification view  of the lumpectomy site demonstrate no suspicious mass, nonsurgical  distortion or worrisome calcifications.  Surgical changes in both breast noted.  IMPRESSION:  No evidence of breast malignancy.  RECOMMENDATION:  Bilateral diagnostic mammogram in 1 year.  I have discussed the findings and recommendations with the patient.  If applicable, a reminder letter will be sent to the patient  regarding the next appointment.  BI-RADS CATEGORY 2: Benign.    HISTORY:   Past Medical History:  Diagnosis Date   Anemia    Arthritis    Rheumatoid and osteoarthritis  Cancer (HCC)    skin,breast  right   Depression    Dysrhythmia    Afib   Hypertension    Hypothyroidism    Pre-diabetes    denies states she is no longer pre DM    Past Surgical History:  Procedure Laterality Date   BREAST SURGERY     CANCER   CHOLECYSTECTOMY     GASTRIC RESTRICTION SURGERY     HERNIA REPAIR     JOINT REPLACEMENT Right    KNEE   REVERSE SHOULDER ARTHROPLASTY Left 12/21/2020   Procedure: REVERSE SHOULDER ARTHROPLASTY;  Surgeon: Francena Hanly, MD;  Location: [facility];  Service: Orthopedics;  Laterality: Left;    TONSILLECTOMY     TOTAL SHOULDER REVISION Left 06/12/2021   Procedure: Revision left reverse shoulder arthroplasty;  Surgeon: Francena Hanly, MD;  Location: [facility];  Service: Orthopedics;  Laterality: Left;    Family History  Problem Relation Age of Onset   Arthritis Mother    Hypertension Mother    Cancer Father        CANCER    Social  History:  reports that she has never smoked. She has never used smokeless tobacco. She reports that she does not drink alcohol and does not use drugs.The patient is accompanied by her husband today.  Allergies:  Allergies  Allergen Reactions   Atorvastatin Other (See Comments)    At 80 mg (?)   Infliximab Rash    remicade    Current Medications: Current Outpatient Medications  Medication Sig Dispense Refill   amiodarone (PACERONE) 200 MG tablet Take 200 mg by mouth daily.     ammonium lactate (LAC-HYDRIN) 12 % lotion Apply 1 Application topically as needed.     atorvastatin (LIPITOR) 40 MG tablet Take 40 mg by mouth daily.     Docusate Sodium (DSS) 100 MG CAPS Take 1 capsule by mouth as needed.     Calcium Carb-Cholecalciferol (CALCIUM 600 + D PO) Take 1 tablet by mouth daily.     cholecalciferol (VITAMIN D3) 25 MCG (1000 UNIT) tablet Take 1,000 Units by mouth daily.     clobetasol (TEMOVATE) 0.05 % external solution Apply 1 Application topically 2 (two) times daily.     docusate sodium (COLACE) 100 MG capsule Take 100 mg by mouth 2 (two) times daily.     furosemide (LASIX) 20 MG tablet Take 20 mg by mouth daily as needed for edema.     golimumab (SIMPONI ARIA) 50 MG/4ML SOLN injection Inject 50 mg into the vein every 8 (eight) weeks.     leflunomide (ARAVA) 20 MG tablet Take 20 mg by mouth daily. (Patient not taking: Reported on 07/15/2023)     levothyroxine (SYNTHROID) 25 MCG tablet Take 25 mcg by mouth daily before breakfast.     losartan (COZAAR) 100 MG tablet      metoprolol succinate (TOPROL-XL) 25 MG 24 hr tablet Take 12.5 mg by mouth daily.     Multiple Vitamin (MULTIVITAMIN ADULT) TABS Take 1 tablet by mouth daily.     ondansetron (ZOFRAN) 4 MG tablet Take 1 tablet (4 mg total) by mouth every 8 (eight) hours as needed for nausea or vomiting. (Patient not taking: No sig reported) 10 tablet 0   PARoxetine (PAXIL) 30 MG tablet Take 30 mg by mouth every morning.     raloxifene  (EVISTA) 60 MG tablet Take 1 tablet (60 mg total) by mouth daily. 30 tablet 5   rivaroxaban (XARELTO) 20 MG TABS tablet Take  by mouth.     vitamin B-12 (CYANOCOBALAMIN) 1000 MCG tablet Take 1,000 mcg by mouth daily.     No current facility-administered medications for this visit.

## 2023-07-15 NOTE — Assessment & Plan Note (Addendum)
History of mild chronic anemia, with recent worsening.  She denies any overt blood loss, but is on rivaroxaban.  Hemoglobin on discharge September was 9.1.  Evaluation in September at her PCP office did not reveal iron or B12 deficiency or evidence of hemolysis.  She remains on B12 oral supplementation, as well as multivitamin.  Ferritin done today was low at 8 consistent with iron deficiency. I recommend IV iron and discussed the potential adverse effects including the possibility of serious allergic reaction, as well as more common side effects of headaches, myalgias/arthragias, flushing, nausea, and irritation at the injection site. I would also recommend she see Dr. Jennye Boroughs to consider EGD and colonoscopy, so will refer back to him.  I will plan to see her back in 6 weeks with a CBC, CMP, iron/TIBC and ferritin to assess her response to treatment.

## 2023-07-16 ENCOUNTER — Telehealth: Payer: Self-pay | Admitting: Hematology and Oncology

## 2023-07-16 ENCOUNTER — Telehealth: Payer: Self-pay

## 2023-07-16 NOTE — Addendum Note (Signed)
Addended by: Lianne Bushy on: 07/16/2023 09:20 AM   Modules accepted: Orders

## 2023-07-16 NOTE — Telephone Encounter (Signed)
07/16/23 Spoke with patient and confirmed next appts.

## 2023-07-16 NOTE — Telephone Encounter (Signed)
-----   Message from Deborah Frazier sent at 07/15/2023  3:27 PM EST ----- Please refer to Dr. Jennye Boroughs for new iron deficiency. He did her last colonoscopy but in 2012. Thanks

## 2023-07-16 NOTE — Telephone Encounter (Signed)
Referral faxed

## 2023-07-17 ENCOUNTER — Inpatient Hospital Stay: Payer: Medicare Other

## 2023-07-17 VITALS — BP 154/60 | HR 65 | Temp 97.7°F | Resp 20 | Wt 166.0 lb

## 2023-07-17 DIAGNOSIS — D649 Anemia, unspecified: Secondary | ICD-10-CM | POA: Diagnosis not present

## 2023-07-17 DIAGNOSIS — D509 Iron deficiency anemia, unspecified: Secondary | ICD-10-CM

## 2023-07-17 MED ORDER — SODIUM CHLORIDE 0.9% FLUSH: 3.0000 mL | Freq: Once | INTRAVENOUS | Status: DC | PRN

## 2023-07-17 MED ORDER — LORATADINE 10 MG PO TABS
10.0000 mg | ORAL_TABLET | Freq: Once | ORAL | Status: AC
  Administered 2023-07-17: 10 mg via ORAL
  Filled 2023-07-17: qty 1

## 2023-07-17 MED ORDER — HEPARIN SOD (PORK) LOCK FLUSH 100 UNIT/ML IV SOLN: 500.0000 [IU] | Freq: Once | INTRAVENOUS | Status: DC | PRN

## 2023-07-17 MED ORDER — SODIUM CHLORIDE 0.9% FLUSH: 10.0000 mL | Freq: Once | INTRAVENOUS | Status: DC | PRN

## 2023-07-17 MED ORDER — ACETAMINOPHEN 325 MG PO TABS
650.0000 mg | ORAL_TABLET | Freq: Once | ORAL | Status: AC
  Administered 2023-07-17: 650 mg via ORAL
  Filled 2023-07-17: qty 2

## 2023-07-17 MED ORDER — HEPARIN SOD (PORK) LOCK FLUSH 100 UNIT/ML IV SOLN: 250.0000 [IU] | Freq: Once | INTRAVENOUS | Status: DC | PRN

## 2023-07-17 MED ORDER — SODIUM CHLORIDE 0.9 % IV SOLN: INTRAVENOUS | Status: DC

## 2023-07-17 MED ORDER — SODIUM CHLORIDE 0.9 % IV SOLN
510.0000 mg | Freq: Once | INTRAVENOUS | Status: AC
Start: 2023-07-17 — End: 2023-07-17
  Administered 2023-07-17: 510 mg via INTRAVENOUS
  Filled 2023-07-17: qty 510

## 2023-07-17 NOTE — Patient Instructions (Signed)
 Ferumoxytol Injection What is this medication? FERUMOXYTOL (FER ue MOX i tol) treats low levels of iron in your body (iron deficiency anemia). Iron is a mineral that plays an important role in making red blood cells, which carry oxygen from your lungs to the rest of your body. This medicine may be used for other purposes; ask your health care provider or pharmacist if you have questions. COMMON BRAND NAME(S): Feraheme What should I tell my care team before I take this medication? They need to know if you have any of these conditions: Anemia not caused by low iron levels High levels of iron in the blood Magnetic resonance imaging (MRI) test scheduled An unusual or allergic reaction to iron, other medications, foods, dyes, or preservatives Pregnant or trying to get pregnant Breastfeeding How should I use this medication? This medication is injected into a vein. It is given by your care team in a hospital or clinic setting. Talk to your care team the use of this medication in children. Special care may be needed. Overdosage: If you think you have taken too much of this medicine contact a poison control center or emergency room at once. NOTE: This medicine is only for you. Do not share this medicine with others. What if I miss a dose? It is important not to miss your dose. Call your care team if you are unable to keep an appointment. What may interact with this medication? Other iron products This list may not describe all possible interactions. Give your health care provider a list of all the medicines, herbs, non-prescription drugs, or dietary supplements you use. Also tell them if you smoke, drink alcohol, or use illegal drugs. Some items may interact with your medicine. What should I watch for while using this medication? Visit your care team regularly. Tell your care team if your symptoms do not start to get better or if they get worse. You may need blood work done while you are taking this  medication. You may need to follow a special diet. Talk to your care team. Foods that contain iron include: whole grains/cereals, dried fruits, beans, or peas, leafy green vegetables, and organ meats (liver, kidney). What side effects may I notice from receiving this medication? Side effects that you should report to your care team as soon as possible: Allergic reactions--skin rash, itching, hives, swelling of the face, lips, tongue, or throat Low blood pressure--dizziness, feeling faint or lightheaded, blurry vision Shortness of breath Side effects that usually do not require medical attention (report to your care team if they continue or are bothersome): Flushing Headache Joint pain Muscle pain Nausea Pain, redness, or irritation at injection site This list may not describe all possible side effects. Call your doctor for medical advice about side effects. You may report side effects to FDA at 1-800-FDA-1088. Where should I keep my medication? This medication is given in a hospital or clinic. It will not be stored at home. NOTE: This sheet is a summary. It may not cover all possible information. If you have questions about this medicine, talk to your doctor, pharmacist, or health care provider.  2024 Elsevier/Gold Standard (2023-01-17 00:00:00)

## 2023-07-22 ENCOUNTER — Inpatient Hospital Stay: Payer: Medicare Other

## 2023-07-22 VITALS — BP 132/47 | HR 62 | Temp 98.0°F | Resp 18

## 2023-07-22 DIAGNOSIS — D509 Iron deficiency anemia, unspecified: Secondary | ICD-10-CM

## 2023-07-22 DIAGNOSIS — D649 Anemia, unspecified: Secondary | ICD-10-CM | POA: Diagnosis not present

## 2023-07-22 MED ORDER — SODIUM CHLORIDE 0.9 % IV SOLN
510.0000 mg | Freq: Once | INTRAVENOUS | Status: AC
  Administered 2023-07-22: 510 mg via INTRAVENOUS
  Filled 2023-07-22: qty 510

## 2023-07-22 MED ORDER — LORATADINE 10 MG PO TABS
10.0000 mg | ORAL_TABLET | Freq: Once | ORAL | Status: AC
Start: 2023-07-22 — End: 2023-07-22
  Administered 2023-07-22: 10 mg via ORAL
  Filled 2023-07-22: qty 1

## 2023-07-22 MED ORDER — ACETAMINOPHEN 325 MG PO TABS
650.0000 mg | ORAL_TABLET | Freq: Once | ORAL | Status: AC
  Administered 2023-07-22: 650 mg via ORAL
  Filled 2023-07-22: qty 2

## 2023-07-22 NOTE — Patient Instructions (Signed)
 Ferumoxytol Injection What is this medication? FERUMOXYTOL (FER ue MOX i tol) treats low levels of iron in your body (iron deficiency anemia). Iron is a mineral that plays an important role in making red blood cells, which carry oxygen from your lungs to the rest of your body. This medicine may be used for other purposes; ask your health care provider or pharmacist if you have questions. COMMON BRAND NAME(S): Feraheme What should I tell my care team before I take this medication? They need to know if you have any of these conditions: Anemia not caused by low iron levels High levels of iron in the blood Magnetic resonance imaging (MRI) test scheduled An unusual or allergic reaction to iron, other medications, foods, dyes, or preservatives Pregnant or trying to get pregnant Breastfeeding How should I use this medication? This medication is injected into a vein. It is given by your care team in a hospital or clinic setting. Talk to your care team the use of this medication in children. Special care may be needed. Overdosage: If you think you have taken too much of this medicine contact a poison control center or emergency room at once. NOTE: This medicine is only for you. Do not share this medicine with others. What if I miss a dose? It is important not to miss your dose. Call your care team if you are unable to keep an appointment. What may interact with this medication? Other iron products This list may not describe all possible interactions. Give your health care provider a list of all the medicines, herbs, non-prescription drugs, or dietary supplements you use. Also tell them if you smoke, drink alcohol, or use illegal drugs. Some items may interact with your medicine. What should I watch for while using this medication? Visit your care team regularly. Tell your care team if your symptoms do not start to get better or if they get worse. You may need blood work done while you are taking this  medication. You may need to follow a special diet. Talk to your care team. Foods that contain iron include: whole grains/cereals, dried fruits, beans, or peas, leafy green vegetables, and organ meats (liver, kidney). What side effects may I notice from receiving this medication? Side effects that you should report to your care team as soon as possible: Allergic reactions--skin rash, itching, hives, swelling of the face, lips, tongue, or throat Low blood pressure--dizziness, feeling faint or lightheaded, blurry vision Shortness of breath Side effects that usually do not require medical attention (report to your care team if they continue or are bothersome): Flushing Headache Joint pain Muscle pain Nausea Pain, redness, or irritation at injection site This list may not describe all possible side effects. Call your doctor for medical advice about side effects. You may report side effects to FDA at 1-800-FDA-1088. Where should I keep my medication? This medication is given in a hospital or clinic. It will not be stored at home. NOTE: This sheet is a summary. It may not cover all possible information. If you have questions about this medicine, talk to your doctor, pharmacist, or health care provider.  2024 Elsevier/Gold Standard (2023-01-17 00:00:00)

## 2023-08-25 ENCOUNTER — Other Ambulatory Visit: Payer: Self-pay | Admitting: Hematology and Oncology

## 2023-08-25 DIAGNOSIS — D509 Iron deficiency anemia, unspecified: Secondary | ICD-10-CM

## 2023-08-25 NOTE — Progress Notes (Deleted)
 [facility]  [address] [place],  [place]  [address] [phone]  Clinic Day:  08/25/2023  Referring physician: Ofilia Lamar CROME, MD  ASSESSMENT & PLAN:  Assessment & Plan: Iron deficiency anemia History of mild chronic anemia, with recent worsening.  She denies any overt blood loss, but is on rivaroxaban.  Hemoglobin on discharge in September was 9.1.  Evaluation in September at her PCP office did not reveal iron or B12 deficiency or evidence of hemolysis.  She remains on B12 oral supplementation, as well as multivitamin.  Ferritin done today was low at 8 consistent with iron deficiency. I recommend IV iron and discussed the potential adverse effects including the possibility of serious allergic reaction, as well as more common side effects of headaches, myalgias/arthragias, flushing, nausea, and irritation at the injection site. I would also recommend she see Dr. Larene to consider EGD and colonoscopy, so will refer back to him.     1. 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  She received adjuvant chemotherapy with CMF, followed by radiotherapy to the right breast, as well as letrozole 2.5 mg daily for 5 years.  She remains without evidence of disease.   2. Rheumatoid arthritis, for which she remains on golimumab.   3. Osteopenia, now osteoporosis.  She continues to take calcium and vitamin-D. Her bone density scan in November, 2023 showed worsening in the left hip and spine, which is now osteoporosis. The Raloxifene  may give her some benefit but I would consider something more, as she is taking this for chemoprevention of breast cancer. I recommended that she might need to take additional medication to improve her bone health, and discussed alendronate . She should discuss this with her primary care provider and her Rheumatologist, Dr. Mai. We discussed other alternatives with Prolia or Reclast. She continues Raloxifene  without any difficulty, started  in October, 2023.    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  This was diagnosed in September of 2023, and treated with lumpectomy. She is on chemoprevention of breast cancer with Raloxifene . This may also give her some benefit with her bones but has only been on it for 6 months, and I am using it for prevention of breast cancer.      The patient understands the plans discussed today and is in agreement with them.  She knows to contact our office if she develops concerns prior to her next appointment.   60 minutes was spent in patient care.  This included time spent preparing to see the patient (e.g., review of tests), obtaining and/or reviewing separately obtained history, counseling and educating the patient/family/caregiver, ordering medications, tests, or procedures; documenting clinical information in the electronic or other health record, independently interpreting results and communicating results to the patient/family/caregiver as well as coordination of care.      Deborah VEAR Cornish, MD  [facility] - A DEPT OF [facility] [address] [place] [address] Dept: [phone] Dept Fax: [phone]   No orders of the defined types were placed in this encounter.     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 female with a remote history of stage IIA (T1c N1a M0) hormone receptor positive right breast cancer diagnosed in  December 2006.  She was treated with lumpectomy.  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  1 of 7 lymph nodes was positive for metastasis.  Estrogen and progesterone receptors were positive and her 2 Neu negative.  She received adjuvant chemotherapy with CMF,  followed by radiotherapy to the right breast, as well as letrozole 2.5 mg daily for 5 years.  She has remained without evidence of recurrence.  Due to her personal and family history of cancer, we recommended she be tested for hereditary cancer syndromes.  The patient had the [employer] myRisk Hereditary Cancer Panel test last year and this was negative for any genetic mutations or variants of uncertain significance.  Annual bilateral mammogram had remained without evidence of malignancy.  She has had chronic anemia dating back to at least 2010.  Evaluation including nutritional studies, SPEP and haptoglobin did not reveal a specific etiology.  She does not know when her last colonoscopy was, but she states she was not advised to have another colonoscopy.  She was hospitalized in 2019 with a small-bowel obstruction.  This resolved with bowel rest. CT abdomen and pelvis in November 2019, at the time of her hospitalization, revealed dilated loops of small bowel and small volume ascites due to small-bowel obstruction, as well as mild biliary ductal dilatation, felt to be most likely secondary to previous cholecystectomy.  Review of her records revealed colonoscopy in October 2012 was negative except for small internal hemorrhoids.  She is seen by rheumatology and has been on Simponi Aria (golimumab) treatment for rheumatoid arthritis.  She has a history of atrial fibrillation and had been on apixaban .  She also has known osteopenia.  She is status post splenectomy and cholecystectomy.    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  She was treated with lumpectomy.  We placed her on raloxifene  for chemoprevention in October 2023.  Bone density scan in November 2023 revealed osteoporosis a T-score of -2.6 in the spine and a T-score of -2.3 in the left femur.  She continues calcium and vitamin D daily.  The raloxifene  may help her bones as well.  Dr. Cornelius discussed treatment of the  osteoporosis with Prolia or Reclast, but the patient decided against it.   Oncology History  Ductal carcinoma in situ (DCIS) of left breast  04/23/2022 Initial Diagnosis   Ductal carcinoma in situ (DCIS) of left breast   06/06/2022 Cancer Staging   Staging form: Breast, AJCC 8th Edition - Clinical stage from 06/06/2022: Stage 0 (cTis (DCIS), cN0, cM0, G3, ER-, PR-, HER2: Not Assessed) - Signed by Cornelius Deborah DEL, MD on 07/04/2022 Histopathologic type: Intraductal carcinoma, noninfiltrating, NOS Method of lymph node assessment: Clinical Nuclear grade: G3 Multigene prognostic tests performed: None Histologic grading system: 3 grade system Laterality: Left Tumor size (mm): 11 Lymph-vascular invasion (LVI): LVI not present (absent)/not identified Diagnostic confirmation: Positive histology Specimen type: Excision Staged by: Managing physician Menopausal status: Postmenopausal Stage used in treatment planning: Yes National guidelines used in treatment planning: Yes Type of national guideline used in treatment planning: NCCN       INTERVAL HISTORY:  Tishanna is here today for 46-month follow-up.  She was hospitalized for observation in September when she presented to [facility] ER with a syncopal episode.  She was found to have a hemoglobin of 9.8.  This dropped to 9.1 with hydration.  EKG revealed normal sinus rhythm.  CT head did not reveal any acute abnormality.  Age-related changes  were seen.  Chest x-ray revealed mild cardiomegaly, no acute cardiopulmonary process.  She denies fevers or chills. She has chronic joint pain, but increased in her knees.  This affects her gait at times.. Her appetite is good. Her weight has increased 2 pounds over last 6 months .  REVIEW OF SYSTEMS:  Review of Systems  Constitutional:  Negative for appetite change, chills, fatigue, fever and unexpected weight change.  HENT:   Negative for lump/mass, mouth sores, nosebleeds and sore throat.    Respiratory:  Negative for cough, hemoptysis and shortness of breath.   Cardiovascular:  Negative for chest pain and leg swelling.  Gastrointestinal:  Negative for abdominal pain, blood in stool, constipation, diarrhea, nausea and vomiting.  Endocrine: Negative for hot flashes.  Genitourinary:  Negative for difficulty urinating, dysuria, frequency, hematuria, vaginal bleeding and vaginal discharge.   Musculoskeletal:  Positive for arthralgias and gait problem (due to knee pain). Negative for back pain and myalgias.  Skin:  Negative for itching and rash.  Neurological:  Positive for gait problem (due to knee pain). Negative for dizziness, extremity weakness, headaches, light-headedness and numbness.  Hematological:  Negative for adenopathy. Does not bruise/bleed easily.  Psychiatric/Behavioral:  Negative for depression and sleep disturbance. The patient is not nervous/anxious.      VITALS:  There were no vitals taken for this visit.  Wt Readings from Last 3 Encounters:  07/17/23 166 lb 0.5 oz (75.3 kg)  07/15/23 162 lb 4.8 oz (73.6 kg)  01/14/23 160 lb 12.8 oz (72.9 kg)    There is no height or weight on file to calculate BMI.  Performance status (ECOG): 2 - Symptomatic, <50% confined to bed  PHYSICAL EXAM:  Physical Exam Vitals and nursing note reviewed.  Constitutional:      General: She is not in acute distress.    Appearance: Normal appearance.  HENT:     Head: Normocephalic and atraumatic.     Mouth/Throat:     Mouth: Mucous membranes are moist.     Pharynx: Oropharynx is clear. No oropharyngeal exudate or posterior oropharyngeal erythema.  Eyes:     General: No scleral icterus.    Extraocular Movements: Extraocular movements intact.     Conjunctiva/sclera: Conjunctivae normal.     Pupils: Pupils are equal, round, and reactive to light.  Cardiovascular:     Rate and Rhythm: Normal rate and regular rhythm.     Heart sounds: Normal heart sounds. No murmur heard.    No  friction rub. No gallop.  Pulmonary:     Effort: Pulmonary effort is normal.     Breath sounds: Normal breath sounds. No wheezing, rhonchi or rales.  Chest:  Breasts:    Right: Normal. No inverted nipple, mass, nipple discharge or skin change.     Left: Normal. No inverted nipple, mass, nipple discharge or skin change.  Abdominal:     General: There is no distension.     Palpations: Abdomen is soft. There is no mass.     Tenderness: There is no abdominal tenderness.  Musculoskeletal:        General: Normal range of motion.     Cervical back: Normal range of motion and neck supple. No tenderness.     Right lower leg: No edema.     Left lower leg: No edema.  Lymphadenopathy:     Cervical: No cervical adenopathy.  Skin:    General: Skin is warm and dry.     Coloration: Skin is not jaundiced.  Findings: No rash.  Neurological:     Mental Status: She is alert and oriented to person, place, and time.     Cranial Nerves: No cranial nerve deficit.  Psychiatric:        Mood and Affect: Mood normal.        Behavior: Behavior normal.        Thought Content: Thought content normal.     LABS:      Latest Ref Rng & Units 07/15/2023    8:05 AM 06/11/2022   12:00 AM 04/24/2022   12:00 AM  CBC  WBC 4.0 - 10.5 K/uL 5.4  4.6     4.8      Hemoglobin 12.0 - 15.0 g/dL 89.5  87.8     88.0      Hematocrit 36.0 - 46.0 % 31.3  36     36      Platelets 150 - 400 K/uL 281  244     241         This result is from an external source.      Latest Ref Rng & Units 07/15/2023    8:05 AM 06/11/2022   12:00 AM 04/24/2022   12:00 AM  CMP  Glucose 70 - 99 mg/dL 92     BUN 8 - 23 mg/dL 22  18     14       Creatinine 0.44 - 1.00 mg/dL 9.02  0.8     0.7      Sodium 135 - 145 mmol/L 142  137     138      Potassium 3.5 - 5.1 mmol/L 3.8  3.8     4.0      Chloride 98 - 111 mmol/L 106  107     105      CO2 22 - 32 mmol/L 26  26     29       Calcium 8.9 - 10.3 mg/dL 9.0  9.0     9.2      Total Protein  6.5 - 8.1 g/dL 6.4     Total Bilirubin <1.2 mg/dL 0.4     Alkaline Phos 38 - 126 U/L 98  82     105      AST 15 - 41 U/L 21  38     40      ALT 0 - 44 U/L 15  30     28          This result is from an external source.     No results found for: CEA1, CEA / No results found for: CEA1, CEA No results found for: PSA1 No results found for: CAN199 No results found for: CAN125  No results found for: TOTALPROTELP, ALBUMINELP, A1GS, A2GS, BETS, BETA2SER, GAMS, MSPIKE, SPEI Lab Results  Component Value Date   TIBC 444 07/15/2023   FERRITIN 8 (L) 07/15/2023   IRONPCTSAT 6 (L) 07/15/2023   No results found for: LDH  STUDIES:    Exam(s): [id number] MAM/MAM DIGITAL W/TOMO DIAG B  CLINICAL DATA: 77 year old female for annual follow-up. History of  LEFT breast cancer and lumpectomy in 2023.  EXAM:  DIGITAL DIAGNOSTIC BILATERAL MAMMOGRAM WITH TOMOSYNTHESIS AND CAD  TECHNIQUE:  Bilateral digital diagnostic mammography and breast tomosynthesis  was performed. The images were evaluated with computer-aided  detection.  COMPARISON: Previous exam(s).  ACR Breast Density Category b: There are scattered areas of  fibroglandular density.  FINDINGS:  2D and 3D full field views of  both breasts and a magnification view  of the lumpectomy site demonstrate no suspicious mass, nonsurgical  distortion or worrisome calcifications.  Surgical changes in both breast noted.  IMPRESSION:  No evidence of breast malignancy.  RECOMMENDATION:  Bilateral diagnostic mammogram in 1 year.  I have discussed the findings and recommendations with the patient.  If applicable, a reminder letter will be sent to the patient  regarding the next appointment.  BI-RADS CATEGORY 2: Benign.    HISTORY:   Past Medical History:  Diagnosis Date   Anemia    Arthritis    Rheumatoid and osteoarthritis   Cancer (HCC)    skin,breast  right   Depression    Dysrhythmia    Afib    Hypertension    Hypothyroidism    Pre-diabetes    denies states she is no longer pre DM    Past Surgical History:  Procedure Laterality Date   BREAST SURGERY     CANCER   CHOLECYSTECTOMY     GASTRIC RESTRICTION SURGERY     HERNIA REPAIR     JOINT REPLACEMENT Right    KNEE   REVERSE SHOULDER ARTHROPLASTY Left 12/21/2020   Procedure: REVERSE SHOULDER ARTHROPLASTY;  Surgeon: Melita Drivers, MD;  Location: [facility];  Service: Orthopedics;  Laterality: Left;    TONSILLECTOMY     TOTAL SHOULDER REVISION Left 06/12/2021   Procedure: Revision left reverse shoulder arthroplasty;  Surgeon: Melita Drivers, MD;  Location: [facility];  Service: Orthopedics;  Laterality: Left;    Family History  Problem Relation Age of Onset   Arthritis Mother    Hypertension Mother    Cancer Father        CANCER    Social History:  reports that she has never smoked. She has never used smokeless tobacco. She reports that she does not drink alcohol and does not use drugs.The patient is accompanied by her husband today.  Allergies:  Allergies  Allergen Reactions   Atorvastatin Other (See Comments)    At 80 mg (?)   Infliximab Rash    remicade    Current Medications: Current Outpatient Medications  Medication Sig Dispense Refill   amiodarone (PACERONE) 200 MG tablet Take 200 mg by mouth daily.     ammonium lactate (LAC-HYDRIN) 12 % lotion Apply 1 Application topically as needed.     atorvastatin (LIPITOR) 40 MG tablet Take 40 mg by mouth daily.     Calcium Carb-Cholecalciferol (CALCIUM 600 + D PO) Take 1 tablet by mouth daily.     cholecalciferol (VITAMIN D3) 25 MCG (1000 UNIT) tablet Take 1,000 Units by mouth daily.     clobetasol (TEMOVATE) 0.05 % external solution Apply 1 Application topically 2 (two) times daily.     docusate sodium  (COLACE) 100 MG capsule Take 100 mg by mouth 2 (two) times daily.     Docusate Sodium  (DSS) 100 MG CAPS Take 1 capsule by mouth as needed.     furosemide (LASIX) 20  MG tablet Take 20 mg by mouth daily as needed for edema.     golimumab (SIMPONI ARIA) 50 MG/4ML SOLN injection Inject 50 mg into the vein every 8 (eight) weeks.     leflunomide  (ARAVA ) 20 MG tablet Take 20 mg by mouth daily. (Patient not taking: Reported on 07/15/2023)     levothyroxine  (SYNTHROID ) 25 MCG tablet Take 25 mcg by mouth daily before breakfast.     losartan  (COZAAR ) 100 MG tablet      metoprolol  succinate (TOPROL -XL) 25  MG 24 hr tablet Take 12.5 mg by mouth daily.     Multiple Vitamin (MULTIVITAMIN ADULT) TABS Take 1 tablet by mouth daily.     ondansetron  (ZOFRAN ) 4 MG tablet Take 1 tablet (4 mg total) by mouth every 8 (eight) hours as needed for nausea or vomiting. (Patient not taking: No sig reported) 10 tablet 0   PARoxetine  (PAXIL ) 30 MG tablet Take 30 mg by mouth every morning.     raloxifene  (EVISTA ) 60 MG tablet Take 1 tablet (60 mg total) by mouth daily. 30 tablet 5   rivaroxaban (XARELTO) 20 MG TABS tablet Take by mouth.     vitamin B-12 (CYANOCOBALAMIN ) 1000 MCG tablet Take 1,000 mcg by mouth daily.     No current facility-administered medications for this visit.

## 2023-08-26 ENCOUNTER — Inpatient Hospital Stay: Payer: Medicare Other | Attending: Hematology and Oncology | Admitting: Oncology

## 2023-08-26 ENCOUNTER — Inpatient Hospital Stay: Payer: Medicare Other

## 2023-08-26 DIAGNOSIS — D509 Iron deficiency anemia, unspecified: Secondary | ICD-10-CM

## 2023-08-26 DIAGNOSIS — D0512 Intraductal carcinoma in situ of left breast: Secondary | ICD-10-CM

## 2023-09-02 ENCOUNTER — Encounter: Payer: Self-pay | Admitting: Hematology and Oncology

## 2023-10-10 NOTE — Progress Notes (Signed)
 [facility]  [address] [place],  [place]  [address] [phone]  Clinic Day:  10/14/23  Referring physician: Olive Bass, MD  ASSESSMENT & PLAN:  Assessment: 1. 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  She received adjuvant chemotherapy with CMF, followed by radiotherapy to the right breast, as well as letrozole 2.5 mg daily for 5 years.  She remains without evidence of disease.   2. Rheumatoid arthritis, for which she remains on golimumab.   3. Osteopenia, now osteoporosis.  She continues to take calcium and vitamin-D. Her bone density scan in November, 2023 showed worsening in the left hip and spine, now osteoporosis. We discussed other alternatives for treating her bones with Prolia or Reclast, but she declined and will discuss with her PCP and Rheumatologist. She continues Raloxifene without any difficulty, started in October, 2023. She will be due for bone density scan in November, 2025.    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  This was diagnosed in September of 2023, and treated with lumpectomy. She is on chemoprevention of breast cancer with Raloxifene. This may also give her some benefit with her bones.   5. History of mild chronic anemia, with recent worsening in September, 2024.  She denies any overt blood loss, but is on rivaroxaban. She remains on B12 oral supplementation, as well as multivitamin.  Ferritin done in November was low at 8 and other studies are also consistent with iron deficiency. She received  IV iron with 2 doses of Feraheme. She will also see Dr. Jennye Boroughs to consider EGD and colonoscopy in March.    Plan: She continues Raloxifene without difficulty. Her last bilateral mammogram was done on 03/26/2023, that was clear, and her upcoming one is already scheduled by Dr. Lequita Halt. She has a WBC of 4.9, hemoglobin of 12.1 up from 10.4 after 2 doses of feraheme, and a  platelet count of 181,000. She is scheduled to see Dr. Jennye Boroughs in March for evaluation of her iron deficiency. Her CMP is completely normal and her ferritin and iron studies are pending. The patient will meet with her PCP in July for her annual follow-up. I will see her back in 3 months with CBC, CMP, ferritin, and iron studies. We will go back to 6 month follow-up after that. She will be due for bone density scan in November, 2025. The patient understands the plans discussed today and is in agreement with them.  She knows to contact our office if she develops concerns prior to her next appointment.  I provided 17 minutes of face-to-face time during this this encounter and > 50% was spent counseling as documented under my assessment and plan.  Dellia Beckwith, MD  [facility] - A DEPT OF [facility] [address] [place] [address] Dept: [phone] Dept Fax: [phone]   No orders of the defined types were placed in this encounter.   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  She was treated with lumpectomy.  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  1 of 7 lymph nodes was positive  for metastasis.  Estrogen and progesterone receptors were positive and her 2 Neu negative.  She received adjuvant chemotherapy with CMF, followed by radiotherapy to the right breast, as well as letrozole 2.5 mg daily for 5 years.  She has remained without evidence of recurrence.  Due to her personal and family history of cancer, we recommended she be tested for hereditary cancer syndromes.  The patient had the My[employer] myRisk Hereditary Cancer  Panel test last year and this was negative for any genetic mutations or variants of uncertain significance.  Annual bilateral mammogram had remained without evidence of malignancy.  She has had chronic anemia dating back to at least 2010.  Evaluation including nutritional studies, SPEP and haptoglobin did not reveal a specific etiology.  She does not know when her last colonoscopy was, but she states she was not advised to have another colonoscopy.  She was hospitalized in 2019 with a small-bowel obstruction.  This resolved with bowel rest. CT abdomen and pelvis in November 2019, at the time of her hospitalization, revealed dilated loops of small bowel and small volume ascites due to small-bowel obstruction, as well as mild biliary ductal dilatation, felt to be most likely secondary to previous cholecystectomy.  Review of her records revealed colonoscopy in October 2012 was negative except for small internal hemorrhoids.  She is seen by rheumatology and has been on Simponi Aria (golimumab) treatment for rheumatoid arthritis.  She has a history of atrial fibrillation and had been on apixaban.  She also has known osteopenia.  She is status post splenectomy and cholecystectomy.    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  She was treated with lumpectomy.  We placed her on raloxifene for chemoprevention in October 2023.  Bone density scan in November 2023 revealed osteoporosis a T-score of -2.6 in the spine and a T-score of -2.3 in the left femur.  She continues calcium and vitamin D daily.  The raloxifene may help her bones as well.  Dr. Gilman Buttner discussed treatment of the osteoporosis with Prolia or Reclast, but the patient decided against it.   Oncology History  Ductal carcinoma in situ (DCIS) of left breast  04/23/2022 Initial Diagnosis   Ductal carcinoma in situ (DCIS) of left breast   06/06/2022 Cancer Staging   Staging form: Breast, AJCC 8th Edition - Clinical stage  from 06/06/2022: Stage 0 (cTis (DCIS), cN0, cM0, G3, ER-, PR-, HER2: Not Assessed) - Signed by Dellia Beckwith, MD on 07/04/2022 Histopathologic type: Intraductal carcinoma, noninfiltrating, NOS Method of lymph node assessment: Clinical Nuclear grade: G3 Multigene prognostic tests performed: None Histologic grading system: 3 grade system Laterality: Left Tumor size (mm): 11 Lymph-vascular invasion (LVI): LVI not present (absent)/not identified Diagnostic confirmation: Positive histology Specimen type: Excision Staged by: Managing physician Menopausal status: Postmenopausal Stage used in treatment planning: Yes National guidelines used in treatment planning: Yes Type of national guideline used in treatment planning: NCCN     INTERVAL HISTORY:  Deborah Frazier is here today for follow-up for her 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 Patient states that she feels well and no complaints of pain. She continues Raloxifene without difficulty. Her last bilateral mammogram was done on 03/26/2023, that was clear, and her upcoming one is already scheduled by Dr. Lequita Halt. She has a WBC of 4.9, hemoglobin of 12.1 up from 10.4 after 2 doses of feraheme, and a platelet count of 181,000. She is scheduled to see Dr. Jennye Boroughs in March for evaluation of her iron deficiency. Her  CMP is completely normal and her ferritin and iron studies are pending. The patient will meet with her PCP in July for her annual follow-up. I will see her back in 3 months with CBC, CMP, ferritin, and iron studies. We will go back to 6 month follow-up after that. She will be due for bone density scan in November, 2025.  She denies signs of infection such as sore throat, sinus drainage, cough, or urinary symptoms.  She denies fevers or recurrent chills. She denies pain. She denies nausea, vomiting, chest pain, dyspnea or cough. Her appetie is good and her weight has decreased 3 pounds over last 3 months . This patient is accompanied in the  office by her spouse.  REVIEW OF SYSTEMS:  Review of Systems  Constitutional: Negative.  Negative for appetite change, chills, diaphoresis, fatigue, fever and unexpected weight change.  HENT:  Negative.  Negative for hearing loss, lump/mass, mouth sores, nosebleeds, sore throat, tinnitus, trouble swallowing and voice change.   Eyes: Negative.  Negative for eye problems and icterus.  Respiratory: Negative.  Negative for chest tightness, cough, hemoptysis, shortness of breath and wheezing.   Cardiovascular: Negative.  Negative for chest pain, leg swelling and palpitations.  Gastrointestinal: Negative.  Negative for abdominal distention, abdominal pain, blood in stool, constipation, diarrhea, nausea, rectal pain and vomiting.  Endocrine: Negative.  Negative for hot flashes.  Genitourinary: Negative.  Negative for bladder incontinence, difficulty urinating, dyspareunia, dysuria, frequency, hematuria, menstrual problem, nocturia, pelvic pain, vaginal bleeding and vaginal discharge.   Musculoskeletal:  Positive for arthralgias and gait problem (due to knee pain). Negative for back pain, flank pain, myalgias, neck pain and neck stiffness.  Skin: Negative.  Negative for itching and rash.  Neurological:  Positive for gait problem (due to knee pain). Negative for dizziness, extremity weakness, headaches, light-headedness, numbness, seizures and speech difficulty.  Hematological: Negative.  Negative for adenopathy. Does not bruise/bleed easily.  Psychiatric/Behavioral: Negative.  Negative for confusion, decreased concentration, depression, sleep disturbance and suicidal ideas. The patient is not nervous/anxious.      VITALS:  Blood pressure (!) 164/80, pulse (!) 50, temperature 97.6 F (36.4 C), temperature source Oral, resp. rate 18, height 5\' 2"  (1.575 m), weight 159 lb 12.8 oz (72.5 kg), SpO2 100%.  Wt Readings from Last 3 Encounters:  10/14/23 159 lb 12.8 oz (72.5 kg)  07/17/23 166 lb 0.5 oz (75.3  kg)  07/15/23 162 lb 4.8 oz (73.6 kg)    Body mass index is 29.23 kg/m.  Performance status (ECOG): 1 - Symptomatic but completely ambulatory  PHYSICAL EXAM:  Physical Exam Vitals and nursing note reviewed. Exam conducted with a chaperone present.  Constitutional:      General: She is not in acute distress.    Appearance: Normal appearance. She is normal weight. She is not ill-appearing, toxic-appearing or diaphoretic.  HENT:     Head: Normocephalic and atraumatic.     Right Ear: Tympanic membrane, ear canal and external ear normal. There is no impacted cerumen.     Left Ear: Tympanic membrane, ear canal and external ear normal. There is no impacted cerumen.     Nose: Nose normal. No congestion or rhinorrhea.     Mouth/Throat:     Mouth: Mucous membranes are moist.     Pharynx: Oropharynx is clear. No oropharyngeal exudate or posterior oropharyngeal erythema.  Eyes:     General: No scleral icterus.       Right eye: No discharge.  Left eye: No discharge.     Extraocular Movements: Extraocular movements intact.     Conjunctiva/sclera: Conjunctivae normal.     Pupils: Pupils are equal, round, and reactive to light.  Neck:     Vascular: No carotid bruit.  Cardiovascular:     Rate and Rhythm: Normal rate and regular rhythm.     Pulses: Normal pulses.     Heart sounds: Normal heart sounds. No murmur heard.    No friction rub. No gallop.  Pulmonary:     Effort: Pulmonary effort is normal. No respiratory distress.     Breath sounds: Normal breath sounds. No stridor. No wheezing, rhonchi or rales.  Chest:     Chest wall: No tenderness.  Breasts:    Right: Normal. No inverted nipple, mass, nipple discharge or skin change.     Left: Normal. No inverted nipple, mass, nipple discharge or skin change.     Comments: Well healed right axillary scar Scar in the left lateral breast in the axillary line which is well healed No masse sin either breast Large well healed incision in  the mid abdomen Abdominal:     General: Bowel sounds are normal. There is no distension.     Palpations: Abdomen is soft. There is no hepatomegaly, splenomegaly or mass.     Tenderness: There is no abdominal tenderness. There is no right CVA tenderness, left CVA tenderness, guarding or rebound.     Hernia: No hernia is present.  Musculoskeletal:        General: No swelling, tenderness, deformity or signs of injury. Normal range of motion.     Cervical back: Normal range of motion and neck supple. No rigidity or tenderness.     Right lower leg: Edema (trace) present.     Left lower leg: Edema (trace) present.  Lymphadenopathy:     Cervical: No cervical adenopathy.     Right cervical: No superficial, deep or posterior cervical adenopathy.    Left cervical: No superficial, deep or posterior cervical adenopathy.     Upper Body:     Right upper body: No supraclavicular, axillary or pectoral adenopathy.     Left upper body: No supraclavicular, axillary or pectoral adenopathy.  Skin:    General: Skin is warm and dry.     Coloration: Skin is not jaundiced or pale.     Findings: No bruising, erythema, lesion or rash.  Neurological:     General: No focal deficit present.     Mental Status: She is alert and oriented to person, place, and time. Mental status is at baseline.     Cranial Nerves: No cranial nerve deficit.     Sensory: No sensory deficit.     Motor: No weakness.     Coordination: Coordination normal.     Gait: Gait normal.     Deep Tendon Reflexes: Reflexes normal.  Psychiatric:        Mood and Affect: Mood normal.        Behavior: Behavior normal.        Thought Content: Thought content normal.        Judgment: Judgment normal.    LABS:      Latest Ref Rng & Units 10/14/2023    8:21 AM 07/15/2023    8:05 AM 06/11/2022   12:00 AM  CBC  WBC 4.0 - 10.5 K/uL 4.9  5.4  4.6      Hemoglobin 12.0 - 15.0 g/dL 01.0  27.2  12.1  Hematocrit 36.0 - 46.0 % 36.1  31.3  36       Platelets 150 - 400 K/uL 181  281  244         This result is from an external source.      Latest Ref Rng & Units 10/14/2023    8:21 AM 07/15/2023    8:05 AM 06/11/2022   12:00 AM  CMP  Glucose 70 - 99 mg/dL 161  92    BUN 8 - 23 mg/dL 13  22  18       Creatinine 0.44 - 1.00 mg/dL 0.96  0.45  0.8      Sodium 135 - 145 mmol/L 142  142  137      Potassium 3.5 - 5.1 mmol/L 4.2  3.8  3.8      Chloride 98 - 111 mmol/L 107  106  107      CO2 22 - 32 mmol/L 25  26  26       Calcium 8.9 - 10.3 mg/dL 9.3  9.0  9.0      Total Protein 6.5 - 8.1 g/dL 6.7  6.4    Total Bilirubin 0.0 - 1.2 mg/dL 0.6  0.4    Alkaline Phos 38 - 126 U/L 95  98  82      AST 15 - 41 U/L 24  21  38      ALT 0 - 44 U/L 17  15  30          This result is from an external source.   No results found for: "CEA1", "CEA" / No results found for: "CEA1", "CEA" No results found for: "PSA1" No results found for: "WUJ811" No results found for: "CAN125"  No results found for: "TOTALPROTELP", "ALBUMINELP", "A1GS", "A2GS", "BETS", "BETA2SER", "GAMS", "MSPIKE", "SPEI" Lab Results  Component Value Date   TIBC 290 10/14/2023   TIBC 444 07/15/2023   FERRITIN 153 10/14/2023   FERRITIN 8 (L) 07/15/2023   IRONPCTSAT 39 (H) 10/14/2023   IRONPCTSAT 6 (L) 07/15/2023  No results found for: "TSH", "T3TOTAL", "T4TOTAL", "THYROIDAB" Lab Results  Component Value Date   FERRITIN 153 10/14/2023   No results found for: "LDH"  STUDIES:    HISTORY:   Past Medical History:  Diagnosis Date   Anemia    Arthritis    Rheumatoid and osteoarthritis   Cancer (HCC)    skin,breast  right   Depression    Dysrhythmia    Afib   Hypertension    Hypothyroidism    Pre-diabetes    denies states she is no longer pre DM    Past Surgical History:  Procedure Laterality Date   BREAST SURGERY     CANCER   CHOLECYSTECTOMY     GASTRIC RESTRICTION SURGERY     HERNIA REPAIR     JOINT REPLACEMENT Right    KNEE   REVERSE SHOULDER  ARTHROPLASTY Left 12/21/2020   Procedure: REVERSE SHOULDER ARTHROPLASTY;  Surgeon: Francena Hanly, MD;  Location: [facility];  Service: Orthopedics;  Laterality: Left;    TONSILLECTOMY     TOTAL SHOULDER REVISION Left 06/12/2021   Procedure: Revision left reverse shoulder arthroplasty;  Surgeon: Francena Hanly, MD;  Location: [facility];  Service: Orthopedics;  Laterality: Left;    Family History  Problem Relation Age of Onset   Arthritis Mother    Hypertension Mother    Cancer Father        CANCER    Social History:  reports that  she has never smoked. She has never used smokeless tobacco. She reports that she does not drink alcohol and does not use drugs.The patient is accompanied by her husband today.  Allergies:  Allergies  Allergen Reactions   Atorvastatin Other (See Comments)    At 80 mg (?)   Infliximab Rash    remicade    Current Medications: Current Outpatient Medications  Medication Sig Dispense Refill   amiodarone (PACERONE) 200 MG tablet Take 200 mg by mouth daily.     ammonium lactate (LAC-HYDRIN) 12 % lotion Apply 1 Application topically as needed.     atorvastatin (LIPITOR) 40 MG tablet Take 40 mg by mouth daily.     Calcium Carb-Cholecalciferol (CALCIUM 600 + D PO) Take 1 tablet by mouth daily.     cholecalciferol (VITAMIN D3) 25 MCG (1000 UNIT) tablet Take 1,000 Units by mouth daily.     clobetasol (TEMOVATE) 0.05 % external solution Apply 1 Application topically 2 (two) times daily.     docusate sodium (COLACE) 100 MG capsule Take 100 mg by mouth 2 (two) times daily.     Docusate Sodium (DSS) 100 MG CAPS Take 1 capsule by mouth as needed.     furosemide (LASIX) 20 MG tablet Take 20 mg by mouth daily as needed for edema.     golimumab (SIMPONI ARIA) 50 MG/4ML SOLN injection Inject 50 mg into the vein every 8 (eight) weeks.     leflunomide (ARAVA) 20 MG tablet Take 20 mg by mouth daily. (Patient not taking: Reported on 07/15/2023)     levothyroxine (SYNTHROID) 25  MCG tablet Take 25 mcg by mouth daily before breakfast.     losartan (COZAAR) 100 MG tablet      metoprolol succinate (TOPROL-XL) 25 MG 24 hr tablet Take 12.5 mg by mouth daily.     Multiple Vitamin (MULTIVITAMIN ADULT) TABS Take 1 tablet by mouth daily.     ondansetron (ZOFRAN) 4 MG tablet Take 1 tablet (4 mg total) by mouth every 8 (eight) hours as needed for nausea or vomiting. (Patient not taking: No sig reported) 10 tablet 0   PARoxetine (PAXIL) 30 MG tablet Take 30 mg by mouth every morning.     raloxifene (EVISTA) 60 MG tablet Take 1 tablet (60 mg total) by mouth daily. 30 tablet 5   rivaroxaban (XARELTO) 20 MG TABS tablet Take by mouth.     vitamin B-12 (CYANOCOBALAMIN) 1000 MCG tablet Take 1,000 mcg by mouth daily.     No current facility-administered medications for this visit.    I,Jasmine M Lassiter,acting as a scribe for Dellia Beckwith, MD.,have documented all relevant documentation on the behalf of Dellia Beckwith, MD,as directed by  Dellia Beckwith, MD while in the presence of Dellia Beckwith, MD.

## 2023-10-14 ENCOUNTER — Inpatient Hospital Stay: Payer: Medicare Other | Attending: Hematology and Oncology | Admitting: Oncology

## 2023-10-14 ENCOUNTER — Telehealth: Payer: Self-pay | Admitting: Oncology

## 2023-10-14 ENCOUNTER — Inpatient Hospital Stay: Payer: Medicare Other

## 2023-10-14 ENCOUNTER — Other Ambulatory Visit: Payer: Self-pay | Admitting: Oncology

## 2023-10-14 VITALS — BP 164/80 | HR 50 | Temp 97.6°F | Resp 18 | Ht 62.0 in | Wt 159.8 lb

## 2023-10-14 DIAGNOSIS — D509 Iron deficiency anemia, unspecified: Secondary | ICD-10-CM

## 2023-10-14 DIAGNOSIS — Z79899 Other long term (current) drug therapy: Secondary | ICD-10-CM | POA: Insufficient documentation

## 2023-10-14 DIAGNOSIS — Z853 Personal history of malignant neoplasm of breast: Secondary | ICD-10-CM | POA: Diagnosis present

## 2023-10-14 DIAGNOSIS — M069 Rheumatoid arthritis, unspecified: Secondary | ICD-10-CM | POA: Insufficient documentation

## 2023-10-14 DIAGNOSIS — M810 Age-related osteoporosis without current pathological fracture: Secondary | ICD-10-CM | POA: Diagnosis not present

## 2023-10-14 DIAGNOSIS — D649 Anemia, unspecified: Secondary | ICD-10-CM | POA: Diagnosis not present

## 2023-10-14 DIAGNOSIS — D0512 Intraductal carcinoma in situ of left breast: Secondary | ICD-10-CM | POA: Insufficient documentation

## 2023-10-14 DIAGNOSIS — Z171 Estrogen receptor negative status [ER-]: Secondary | ICD-10-CM | POA: Insufficient documentation

## 2023-10-14 LAB — FERRITIN: Ferritin: 153 ng/mL (ref 11–307)

## 2023-10-14 LAB — CMP (CANCER CENTER ONLY)
ALT: 17 U/L (ref 0–44)
AST: 24 U/L (ref 15–41)
Albumin: 3.7 g/dL (ref 3.5–5.0)
Alkaline Phosphatase: 95 U/L (ref 38–126)
Anion gap: 9 (ref 5–15)
BUN: 13 mg/dL (ref 8–23)
CO2: 25 mmol/L (ref 22–32)
Calcium: 9.3 mg/dL (ref 8.9–10.3)
Chloride: 107 mmol/L (ref 98–111)
Creatinine: 0.85 mg/dL (ref 0.44–1.00)
GFR, Estimated: >60 mL/min (ref >60)
Glucose, Bld: 101 mg/dL — ABNORMAL HIGH (ref 70–99)
Potassium: 4.2 mmol/L (ref 3.5–5.1)
Sodium: 142 mmol/L (ref 135–145)
Total Bilirubin: 0.6 mg/dL (ref 0.0–1.2)
Total Protein: 6.7 g/dL (ref 6.5–8.1)

## 2023-10-14 LAB — CBC WITH DIFFERENTIAL (CANCER CENTER ONLY)
Abs Immature Granulocytes: 0 10*3/uL (ref 0.00–0.07)
Basophils Absolute: 0.1 10*3/uL (ref 0.0–0.1)
Basophils Relative: 1 %
Eosinophils Absolute: 0.1 10*3/uL (ref 0.0–0.5)
Eosinophils Relative: 3 %
HCT: 36.1 % (ref 36.0–46.0)
Hemoglobin: 12.1 g/dL (ref 12.0–15.0)
Immature Granulocytes: 0 %
Lymphocytes Relative: 52 %
Lymphs Abs: 2.6 10*3/uL (ref 0.7–4.0)
MCH: 32.3 pg (ref 26.0–34.0)
MCHC: 33.5 g/dL (ref 30.0–36.0)
MCV: 96.3 fL (ref 80.0–100.0)
Monocytes Absolute: 0.6 10*3/uL (ref 0.1–1.0)
Monocytes Relative: 13 %
Neutro Abs: 1.5 10*3/uL — ABNORMAL LOW (ref 1.7–7.7)
Neutrophils Relative %: 31 %
Platelet Count: 181 10*3/uL (ref 150–400)
RBC: 3.75 MIL/uL — ABNORMAL LOW (ref 3.87–5.11)
RDW: 17.1 % — ABNORMAL HIGH (ref 11.5–15.5)
WBC Count: 4.9 10*3/uL (ref 4.0–10.5)
nRBC: 0 % (ref 0.0–0.2)
nRBC: 0 /100{WBCs}

## 2023-10-14 LAB — IRON AND TIBC
Iron: 114 ug/dL (ref 28–170)
Saturation Ratios: 39 % — ABNORMAL HIGH (ref 10.4–31.8)
TIBC: 290 ug/dL (ref 250–450)
UIBC: 176 ug/dL

## 2023-10-14 NOTE — Telephone Encounter (Signed)
 10/14/23 Next appt scheduled and confirmed with patient

## 2023-10-21 ENCOUNTER — Encounter: Payer: Self-pay | Admitting: Oncology

## 2023-10-21 ENCOUNTER — Encounter: Payer: Self-pay | Admitting: Hematology and Oncology

## 2023-11-24 ENCOUNTER — Other Ambulatory Visit: Payer: Self-pay | Admitting: Oncology

## 2023-11-24 DIAGNOSIS — D0512 Intraductal carcinoma in situ of left breast: Secondary | ICD-10-CM

## 2023-12-03 LAB — COMPREHENSIVE METABOLIC PANEL (CC13): EGFR: 89

## 2023-12-29 ENCOUNTER — Telehealth: Payer: Self-pay

## 2023-12-29 NOTE — Telephone Encounter (Signed)
 Pt called this am to notify you that she has stopped taking the raloxifene . She read more about the side effects of the drug and didn't want to risk a stroke or blood clot (she mentioned she had a blood clot before). She has a brand new bottle that hasn't been opened and thought she may could return it as it was $95, if you could send a note. I told her that I was sure the pharmacy wouldn't take any medication back, once dispensed.

## 2024-01-06 ENCOUNTER — Encounter: Payer: Self-pay | Admitting: Hematology and Oncology

## 2024-01-09 NOTE — Progress Notes (Signed)
 [facility]  [address] [place],  [place]  [address] [phone]  Clinic Day: 01/13/2024  Referring physician: Madlyn Schirmer, MD  ASSESSMENT & PLAN:  Assessment: 1. 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  She received adjuvant chemotherapy with CMF, followed by radiotherapy to the right breast, as well as letrozole 2.5 mg daily for 5 years.  She remains without evidence of disease.   2. Rheumatoid arthritis, for which she remains on golimumab.   3. Osteopenia, now osteoporosis.  She continues to take calcium and vitamin-D. Her bone density scan in November, 2023 showed worsening in the left hip and spine, now osteoporosis. We discussed alternatives for treating her bones with Prolia or Reclast, but she declined and will discuss with her PCP and Rheumatologist. She will be due for bone density scan in November, 2025.    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  This was diagnosed in September of 2023, and treated with lumpectomy. She was offered chemoprevention of breast cancer with Raloxifene  but has decided not to take that.    5. History of mild chronic anemia, with recent worsening in September, 2024.  She denies any overt blood loss, but is on rivaroxaban. She remains on B12 oral supplementation, as well as multivitamin.  Ferritin done in November was low at 8 and other studies are also consistent with iron deficiency. She received  IV iron with 2 doses of Feraheme  and her hemoglobin came up to 13.3 on April, 9th in the ER and was 12.1 in February, now down to 11.0. She has seen Dr. Monico Anna in March and he has offered endoscopy but not pushed for it.   Plan: She continues her Simponi Aria injection 50 mg every 8 weeks. She will be due for a repeat bone density scan in November, 2025. Patient informed me that she never started Raloxifene  due to its potential side effects. We discussed this is  optional as chemoprevention, but would help her bones. She has a WBC of 4.4, hemoglobin of 11.0 down from 12.1, and platelet count of 206,000. She has seen Dr. Monico Anna and he has offered endoscopy but not pushed for it. Her husband inquired about if she should have a colonoscopy or not. I think it would be wise to have one due to her recent drop in hemoglobin even after IV iron. I stated that there is no rush since she is healthy but if her blood counts continue to drop then we would want to have one sooner. Her husband informed me that she will have labs done next week and they will make a decision based off of that. Her CMP is normal with a low-normal total protein of 6.4. Her TSH and T4 level are pending and I will add B-12 and folate levels to her labs today. I will call her with the results. She will see Dr. Britta Candy this summer with annual mammogram. I will see her back in 6 months with CBC, CMP, and bone density scan. The patient understands the plans discussed today and is in agreement with them.  She knows to contact our office if she develops concerns prior to her next appointment.  I provided 20 minutes of face-to-face time during this this encounter and > 50% was spent counseling as documented under my assessment and plan.  Nolia Baumgartner, MD  [facility]  Katheryn Pandy [address] [place] [address] Dept: [phone] Dept Fax: [phone]   No orders of the defined types were placed in this encounter.   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  She was treated with lumpectomy.  Pathology revealed a 1.2 cm, grade 2, invasive  ductal carcinoma.  1 of 7 lymph nodes was positive for metastasis.  Estrogen and progesterone receptors were positive and her 2 Neu negative.  She received adjuvant chemotherapy with CMF, followed by radiotherapy to the right breast, as well as letrozole 2.5 mg daily for 5 years.  She has remained without evidence of recurrence.  Due to her personal and family history of cancer, we recommended she be tested for hereditary cancer syndromes.  The patient had the [employer] myRisk Hereditary Cancer Panel test last year and this was negative for any genetic mutations or variants of uncertain significance.  Annual bilateral mammogram had remained without evidence of malignancy.  She has had chronic anemia dating back to at least 2010.  Evaluation including nutritional studies, SPEP and haptoglobin did not reveal a specific etiology.  She does not know when her last colonoscopy was, but she states she was not advised to have another colonoscopy.  She was hospitalized in 2019 with a small-bowel obstruction.  This resolved with bowel rest. CT abdomen and pelvis in November 2019, at the time of her hospitalization, revealed dilated loops of small bowel and small volume ascites due to small-bowel obstruction, as well as mild biliary ductal dilatation, felt to be most likely secondary to previous cholecystectomy.  Review of her records revealed colonoscopy in October 2012 was negative except for small internal hemorrhoids.  She is seen by rheumatology and has been on Simponi Aria (golimumab) treatment for rheumatoid arthritis.  She has a history of atrial fibrillation and had been on apixaban .  She also has known osteopenia.  She is status post splenectomy and cholecystectomy.    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  She was treated with lumpectomy.  We offered her raloxifene  for chemoprevention in October 2023.  Bone density scan in November 2023 revealed osteoporosis a T-score of -2.6 in  the spine and a T-score of -2.3 in the left femur.  She continues calcium and vitamin D daily.  The raloxifene  may help her bones as well, but she decided not to take it after reading the possible side effects.  I discussed treatment of the osteoporosis with Prolia or Reclast, but the patient decided against it.   Oncology History  Ductal carcinoma in situ (DCIS) of left breast  04/23/2022 Initial Diagnosis   Ductal carcinoma in situ (DCIS) of left breast   06/06/2022 Cancer Staging   Staging form: Breast, AJCC 8th Edition - Clinical stage from 06/06/2022: Stage 0 (cTis (DCIS), cN0, cM0, G3, ER-, PR-, HER2: Not Assessed) - Signed by Nolia Baumgartner, MD on 07/04/2022 Histopathologic type: Intraductal carcinoma, noninfiltrating, NOS Method of lymph node assessment: Clinical Nuclear grade: G3 Multigene prognostic tests performed: None Histologic grading system: 3 grade system Laterality: Left Tumor size (mm): 11 Lymph-vascular invasion (LVI): LVI not present (absent)/not identified Diagnostic confirmation: Positive histology Specimen type: Excision Staged by: Managing physician Menopausal status:  Postmenopausal Stage used in treatment planning: Yes National guidelines used in treatment planning: Yes Type of national guideline used in treatment planning: NCCN     INTERVAL HISTORY:  Kamilia is here today for follow-up for her 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 Patient states that she feels well and has no complaints of pain.  She continues her Simponi Aria injection 50mg  every 8 weeks. She will be due for a repeat bone density scan in November, 2025. Patient informed me that she never started Raloxifene  due to its potential side effects. We discussed this is optional as chemoprevention, but would help her bones. She has a WBC of 4.4, hemoglobin of 11.0 down from 12.1, and platelet count of 206,000. She has seen Dr. Monico Anna and he has offered endoscopy but not pushed for it. Her  husband inquired about if she should have a colonoscopy or not. I think it would be wise to have one due to her recent drop in hemoglobin even after IV iron. I stated that there is no rush since she is healthy but if her blood counts continue to drop then we would want to have one sooner. Her husband informed me that she will have labs done next week and they will make a decision based off of that. Her CMP is normal with a low-normal total protein of 6.4. Her TSH and T4 level are pending and I will add B-12 and folate levels to her labs today. I will call her with the results. She will see Dr. Britta Candy this summer with annual mammogram. I will see her back in 6 months with CBC, CMP, and bone density scan.   She denies fever, chills, night sweats, or other signs of infection. She denies cardiorespiratory and gastrointestinal issues. She  denies pain. Her appetite is very good and Her weight has decreased 6 pounds over last 3 months. This patient is accompanied in the office by her husband.    REVIEW OF SYSTEMS:  Review of Systems  Constitutional: Negative.  Negative for appetite change, chills, diaphoresis, fatigue, fever and unexpected weight change.  HENT:  Negative.  Negative for hearing loss, lump/mass, mouth sores, nosebleeds, sore throat, tinnitus, trouble swallowing and voice change.   Eyes: Negative.  Negative for eye problems and icterus.  Respiratory: Negative.  Negative for chest tightness, cough, hemoptysis, shortness of breath and wheezing.   Cardiovascular: Negative.  Negative for chest pain, leg swelling and palpitations.  Gastrointestinal: Negative.  Negative for abdominal distention, abdominal pain, blood in stool, constipation, diarrhea, nausea, rectal pain and vomiting.  Endocrine: Negative.  Negative for hot flashes.  Genitourinary: Negative.  Negative for bladder incontinence, difficulty urinating, dyspareunia, dysuria, frequency, hematuria, menstrual problem, nocturia, pelvic pain,  vaginal bleeding and vaginal discharge.   Musculoskeletal:  Positive for arthralgias and gait problem (due to knee pain). Negative for back pain, flank pain, myalgias, neck pain and neck stiffness.  Skin: Negative.  Negative for itching and rash.  Neurological:  Positive for gait problem (due to knee pain). Negative for dizziness, extremity weakness, headaches, light-headedness, numbness, seizures and speech difficulty.  Hematological: Negative.  Negative for adenopathy. Does not bruise/bleed easily.  Psychiatric/Behavioral: Negative.  Negative for confusion, decreased concentration, depression, sleep disturbance and suicidal ideas. The patient is not nervous/anxious.    VITALS:  Blood pressure (!) 142/66, pulse (!) 50, temperature 97.6 F (36.4 C), temperature source Oral, resp. rate 18, height 5\' 2"  (1.575 m), weight 153 lb 3.2 oz (69.5 kg), SpO2 100%.  Wt Readings from  Last 3 Encounters:  01/13/24 153 lb 3.2 oz (69.5 kg)  10/14/23 159 lb 12.8 oz (72.5 kg)  07/17/23 166 lb 0.5 oz (75.3 kg)    Body mass index is 28.02 kg/m.  Performance status (ECOG): 1 - Symptomatic but completely ambulatory  PHYSICAL EXAM:  Physical Exam Vitals and nursing note reviewed. Exam conducted with a chaperone present.  Constitutional:      General: She is not in acute distress.    Appearance: Normal appearance. She is normal weight. She is not ill-appearing, toxic-appearing or diaphoretic.  HENT:     Head: Normocephalic and atraumatic.     Right Ear: Tympanic membrane, ear canal and external ear normal. There is no impacted cerumen.     Left Ear: Tympanic membrane, ear canal and external ear normal. There is no impacted cerumen.     Nose: Nose normal. No congestion or rhinorrhea.     Mouth/Throat:     Mouth: Mucous membranes are moist.     Pharynx: Oropharynx is clear. No oropharyngeal exudate or posterior oropharyngeal erythema.  Eyes:     General: No scleral icterus.       Right eye: No discharge.         Left eye: No discharge.     Extraocular Movements: Extraocular movements intact.     Conjunctiva/sclera: Conjunctivae normal.     Pupils: Pupils are equal, round, and reactive to light.  Neck:     Vascular: No carotid bruit.  Cardiovascular:     Rate and Rhythm: Regular rhythm. Bradycardia present.     Pulses: Normal pulses.     Heart sounds: Normal heart sounds. No murmur heard.    No friction rub. No gallop.  Pulmonary:     Effort: Pulmonary effort is normal. No respiratory distress.     Breath sounds: Normal breath sounds. No stridor. No wheezing, rhonchi or rales.  Chest:     Chest wall: No tenderness.  Breasts:    Right: Normal. No inverted nipple, mass, nipple discharge or skin change.     Left: Normal. No inverted nipple, mass, nipple discharge or skin change.     Comments: Well healed right axillary scar Faded scar in the lower outer quadrant of the right breast No masses in either breast Abdominal:     General: Bowel sounds are normal. There is no distension.     Palpations: Abdomen is soft. There is no hepatomegaly, splenomegaly or mass.     Tenderness: There is no abdominal tenderness. There is no right CVA tenderness, left CVA tenderness, guarding or rebound.     Hernia: No hernia is present.     Comments: Well healed mid abdominal scar    Musculoskeletal:        General: No swelling, tenderness or signs of injury. Normal range of motion.     Right hand: Deformity present.     Left hand: Deformity present.     Cervical back: Normal range of motion and neck supple. No rigidity or tenderness.     Right lower leg: 1+ Edema (trace) present.     Left lower leg: 1+ Edema (trace) present.     Comments: Rheumatoid deformities of both hands  Lymphadenopathy:     Cervical: No cervical adenopathy.     Right cervical: No superficial, deep or posterior cervical adenopathy.    Left cervical: No superficial, deep or posterior cervical adenopathy.     Upper Body:     Right  upper body: No supraclavicular, axillary  or pectoral adenopathy.     Left upper body: No supraclavicular, axillary or pectoral adenopathy.  Skin:    General: Skin is warm and dry.     Coloration: Skin is not jaundiced or pale.     Findings: No bruising, erythema, lesion or rash.  Neurological:     General: No focal deficit present.     Mental Status: She is alert and oriented to person, place, and time. Mental status is at baseline.     Cranial Nerves: No cranial nerve deficit.     Sensory: No sensory deficit.     Motor: No weakness.     Coordination: Coordination normal.     Gait: Gait normal.     Deep Tendon Reflexes: Reflexes normal.  Psychiatric:        Mood and Affect: Mood normal.        Behavior: Behavior normal.        Thought Content: Thought content normal.        Judgment: Judgment normal.    LABS:      Latest Ref Rng & Units 01/13/2024    8:40 AM 10/14/2023    8:21 AM 07/15/2023    8:05 AM  CBC  WBC 4.0 - 10.5 K/uL 4.4  4.9  5.4   Hemoglobin 12.0 - 15.0 g/dL 95.6  21.3  08.6   Hematocrit 36.0 - 46.0 % 33.6  36.1  31.3   Platelets 150 - 400 K/uL 206  181  281       Latest Ref Rng & Units 01/13/2024    8:40 AM 10/14/2023    8:21 AM 07/15/2023    8:05 AM  CMP  Glucose 70 - 99 mg/dL 99  578  92   BUN 8 - 23 mg/dL 20  13  22    Creatinine 0.44 - 1.00 mg/dL 4.69  6.29  5.28   Sodium 135 - 145 mmol/L 142  142  142   Potassium 3.5 - 5.1 mmol/L 4.4  4.2  3.8   Chloride 98 - 111 mmol/L 105  107  106   CO2 22 - 32 mmol/L 26  25  26    Calcium 8.9 - 10.3 mg/dL 9.8  9.3  9.0   Total Protein 6.5 - 8.1 g/dL 6.4  6.7  6.4   Total Bilirubin 0.0 - 1.2 mg/dL 0.6  0.6  0.4   Alkaline Phos 38 - 126 U/L 91  95  98   AST 15 - 41 U/L 30  24  21    ALT 0 - 44 U/L 26  17  15     No results found for: "CEA1", "CEA" / No results found for: "CEA1", "CEA" No results found for: "PSA1" No results found for: "UXL244" No results found for: "CAN125"  No results found for: "TOTALPROTELP",  "ALBUMINELP", "A1GS", "A2GS", "BETS", "BETA2SER", "GAMS", "MSPIKE", "SPEI" Lab Results  Component Value Date   TIBC 290 01/13/2024   TIBC 290 10/14/2023   TIBC 444 07/15/2023   FERRITIN 150 01/13/2024   FERRITIN 153 10/14/2023   FERRITIN 8 (L) 07/15/2023   IRONPCTSAT 40 (H) 01/13/2024   IRONPCTSAT 39 (H) 10/14/2023   IRONPCTSAT 6 (L) 07/15/2023  No results found for: "TSH", "T3TOTAL", "T4TOTAL", "THYROIDAB" Lab Results  Component Value Date   FERRITIN 150 01/13/2024   No results found for: "LDH"  STUDIES:     HISTORY:   Past Medical History:  Diagnosis Date   Anemia    Arthritis    Rheumatoid and  osteoarthritis   Cancer (HCC)    skin,breast  right   Depression    Dysrhythmia    Afib   Hypertension    Hypothyroidism    Pre-diabetes    denies states she is no longer pre DM    Past Surgical History:  Procedure Laterality Date   BREAST SURGERY     CANCER   CHOLECYSTECTOMY     GASTRIC RESTRICTION SURGERY     HERNIA REPAIR     JOINT REPLACEMENT Right    KNEE   REVERSE SHOULDER ARTHROPLASTY Left 12/21/2020   Procedure: REVERSE SHOULDER ARTHROPLASTY;  Surgeon: Ellard Gunning, MD;  Location: [facility];  Service: Orthopedics;  Laterality: Left;    TONSILLECTOMY     TOTAL SHOULDER REVISION Left 06/12/2021   Procedure: Revision left reverse shoulder arthroplasty;  Surgeon: Ellard Gunning, MD;  Location: [facility];  Service: Orthopedics;  Laterality: Left;    Family History  Problem Relation Age of Onset   Arthritis Mother    Hypertension Mother    Cancer Father        CANCER    Social History:  reports that she has never smoked. She has never used smokeless tobacco. She reports that she does not drink alcohol and does not use drugs.The patient is accompanied by her husband today.  Allergies:  Allergies  Allergen Reactions   Atorvastatin Other (See Comments)    At 80 mg (?)   Infliximab Rash and Dermatitis    remicade    Current Medications: Current  Outpatient Medications  Medication Sig Dispense Refill   amiodarone (PACERONE) 200 MG tablet Take 200 mg by mouth daily.     atorvastatin (LIPITOR) 40 MG tablet Take 40 mg by mouth daily.     Calcium Carb-Cholecalciferol (CALCIUM 600 + D PO) Take 1 tablet by mouth daily.     cholecalciferol (VITAMIN D3) 25 MCG (1000 UNIT) tablet Take 1,000 Units by mouth daily.     furosemide (LASIX) 20 MG tablet Take 20 mg by mouth daily as needed for edema.     golimumab (SIMPONI ARIA) 50 MG/4ML SOLN injection Inject 50 mg into the vein every 8 (eight) weeks.     leflunomide  (ARAVA ) 20 MG tablet Take 20 mg by mouth daily.     levothyroxine  (SYNTHROID ) 25 MCG tablet Take 25 mcg by mouth daily before breakfast.     losartan  (COZAAR ) 25 MG tablet Take 25 mg by mouth daily.     metoprolol  succinate (TOPROL -XL) 25 MG 24 hr tablet Take 12.5 mg by mouth daily.     Multiple Vitamin (MULTIVITAMIN ADULT) TABS Take 1 tablet by mouth daily.     PARoxetine  (PAXIL ) 30 MG tablet Take 30 mg by mouth every morning.     rivaroxaban (XARELTO) 20 MG TABS tablet Take by mouth.     vitamin B-12 (CYANOCOBALAMIN) 1000 MCG tablet Take 1,000 mcg by mouth daily.     ammonium lactate (LAC-HYDRIN) 12 % lotion Apply 1 Application topically as needed.     clobetasol (TEMOVATE) 0.05 % external solution Apply 1 Application topically 2 (two) times daily.     docusate sodium  (COLACE) 100 MG capsule Take 100 mg by mouth 2 (two) times daily.     No current facility-administered medications for this visit.    I,Jasmine M Lassiter,acting as a scribe for Nolia Baumgartner, MD.,have documented all relevant documentation on the behalf of Nolia Baumgartner, MD,as directed by  Nolia Baumgartner, MD while in the presence of  Nolia Baumgartner, MD.

## 2024-01-13 ENCOUNTER — Inpatient Hospital Stay: Payer: BLUE CROSS/BLUE SHIELD | Attending: Hematology and Oncology | Admitting: Oncology

## 2024-01-13 ENCOUNTER — Other Ambulatory Visit: Payer: Self-pay | Admitting: Oncology

## 2024-01-13 ENCOUNTER — Other Ambulatory Visit: Payer: Self-pay

## 2024-01-13 ENCOUNTER — Encounter: Payer: Self-pay | Admitting: Hematology and Oncology

## 2024-01-13 ENCOUNTER — Encounter: Payer: Self-pay | Admitting: Oncology

## 2024-01-13 ENCOUNTER — Inpatient Hospital Stay: Payer: BLUE CROSS/BLUE SHIELD

## 2024-01-13 ENCOUNTER — Telehealth: Payer: Self-pay | Admitting: Oncology

## 2024-01-13 VITALS — BP 142/66 | HR 50 | Temp 97.6°F | Resp 18 | Ht 62.0 in | Wt 153.2 lb

## 2024-01-13 DIAGNOSIS — D509 Iron deficiency anemia, unspecified: Secondary | ICD-10-CM

## 2024-01-13 DIAGNOSIS — Z853 Personal history of malignant neoplasm of breast: Secondary | ICD-10-CM | POA: Diagnosis present

## 2024-01-13 DIAGNOSIS — M810 Age-related osteoporosis without current pathological fracture: Secondary | ICD-10-CM | POA: Diagnosis not present

## 2024-01-13 DIAGNOSIS — D0512 Intraductal carcinoma in situ of left breast: Secondary | ICD-10-CM | POA: Diagnosis present

## 2024-01-13 DIAGNOSIS — M069 Rheumatoid arthritis, unspecified: Secondary | ICD-10-CM | POA: Insufficient documentation

## 2024-01-13 LAB — CMP (CANCER CENTER ONLY)
ALT: 26 U/L (ref 0–44)
AST: 30 U/L (ref 15–41)
Albumin: 3.7 g/dL (ref 3.5–5.0)
Alkaline Phosphatase: 91 U/L (ref 38–126)
Anion gap: 11 (ref 5–15)
BUN: 20 mg/dL (ref 8–23)
CO2: 26 mmol/L (ref 22–32)
Calcium: 9.8 mg/dL (ref 8.9–10.3)
Chloride: 105 mmol/L (ref 98–111)
Creatinine: 0.85 mg/dL (ref 0.44–1.00)
GFR, Estimated: >60 mL/min (ref >60)
Glucose, Bld: 99 mg/dL (ref 70–99)
Potassium: 4.4 mmol/L (ref 3.5–5.1)
Sodium: 142 mmol/L (ref 135–145)
Total Bilirubin: 0.6 mg/dL (ref 0.0–1.2)
Total Protein: 6.4 g/dL — ABNORMAL LOW (ref 6.5–8.1)

## 2024-01-13 LAB — CBC WITH DIFFERENTIAL (CANCER CENTER ONLY)
Abs Immature Granulocytes: 0.02 10*3/uL (ref 0.00–0.07)
Basophils Absolute: 0.1 10*3/uL (ref 0.0–0.1)
Basophils Relative: 1 %
Eosinophils Absolute: 0.1 10*3/uL (ref 0.0–0.5)
Eosinophils Relative: 3 %
HCT: 33.6 % — ABNORMAL LOW (ref 36.0–46.0)
Hemoglobin: 11 g/dL — ABNORMAL LOW (ref 12.0–15.0)
Immature Granulocytes: 1 %
Lymphocytes Relative: 36 %
Lymphs Abs: 1.6 10*3/uL (ref 0.7–4.0)
MCH: 33.1 pg (ref 26.0–34.0)
MCHC: 32.7 g/dL (ref 30.0–36.0)
MCV: 101.2 fL — ABNORMAL HIGH (ref 80.0–100.0)
Monocytes Absolute: 0.5 10*3/uL (ref 0.1–1.0)
Monocytes Relative: 11 %
Neutro Abs: 2.2 10*3/uL (ref 1.7–7.7)
Neutrophils Relative %: 48 %
Platelet Count: 206 10*3/uL (ref 150–400)
RBC: 3.32 MIL/uL — ABNORMAL LOW (ref 3.87–5.11)
RDW: 14.2 % (ref 11.5–15.5)
WBC Count: 4.4 10*3/uL (ref 4.0–10.5)
nRBC: 0 % (ref 0.0–0.2)

## 2024-01-13 LAB — IRON AND TIBC
Iron: 116 ug/dL (ref 28–170)
Saturation Ratios: 40 % — ABNORMAL HIGH (ref 10.4–31.8)
TIBC: 290 ug/dL (ref 250–450)
UIBC: 174 ug/dL

## 2024-01-13 LAB — VITAMIN B12: Vitamin B-12: 3211 pg/mL — ABNORMAL HIGH (ref 180–914)

## 2024-01-13 LAB — FERRITIN: Ferritin: 150 ng/mL (ref 11–307)

## 2024-01-13 LAB — FOLATE: Folate: 21 ng/mL (ref >5.9)

## 2024-01-13 NOTE — Telephone Encounter (Signed)
 Patient has been scheduled for follow-up visit per 01/12/24 LOS.  Pt given an appt calendar with date and time.

## 2024-01-20 ENCOUNTER — Encounter: Payer: Self-pay | Admitting: Hematology and Oncology

## 2024-01-26 ENCOUNTER — Telehealth: Payer: Self-pay

## 2024-01-26 NOTE — Telephone Encounter (Signed)
 Attempted to contact patient. No answer.

## 2024-01-26 NOTE — Telephone Encounter (Signed)
-----   Message from Nolia Baumgartner sent at 01/20/2024 12:47 PM EDT ----- Regarding: call Tell her the iron levels look good, remained stable.  Her B12 is very high, over 3000, and folate is normal.

## 2024-03-31 LAB — HM MAMMOGRAPHY

## 2024-06-30 ENCOUNTER — Ambulatory Visit (INDEPENDENT_AMBULATORY_CARE_PROVIDER_SITE_OTHER)
Admission: RE | Admit: 2024-06-30 | Discharge: 2024-06-30 | Disposition: A | Discharge status: Home / Self Care | Source: Ambulatory Visit | Attending: Oncology | Admitting: Oncology

## 2024-06-30 DIAGNOSIS — M810 Age-related osteoporosis without current pathological fracture: Secondary | ICD-10-CM | POA: Diagnosis not present

## 2024-07-07 ENCOUNTER — Encounter: Payer: Self-pay | Admitting: Oncology

## 2024-07-07 ENCOUNTER — Inpatient Hospital Stay: Attending: Oncology | Admitting: Oncology

## 2024-07-07 ENCOUNTER — Telehealth: Payer: Self-pay | Admitting: Oncology

## 2024-07-07 ENCOUNTER — Other Ambulatory Visit: Payer: Self-pay | Admitting: Oncology

## 2024-07-07 ENCOUNTER — Inpatient Hospital Stay

## 2024-07-07 VITALS — BP 182/80 | HR 54 | Temp 97.5°F | Resp 18 | Ht 62.0 in | Wt 157.5 lb

## 2024-07-07 DIAGNOSIS — D509 Iron deficiency anemia, unspecified: Secondary | ICD-10-CM

## 2024-07-07 DIAGNOSIS — D0512 Intraductal carcinoma in situ of left breast: Secondary | ICD-10-CM

## 2024-07-07 DIAGNOSIS — Z809 Family history of malignant neoplasm, unspecified: Secondary | ICD-10-CM | POA: Diagnosis not present

## 2024-07-07 DIAGNOSIS — M069 Rheumatoid arthritis, unspecified: Secondary | ICD-10-CM | POA: Insufficient documentation

## 2024-07-07 DIAGNOSIS — M810 Age-related osteoporosis without current pathological fracture: Secondary | ICD-10-CM | POA: Insufficient documentation

## 2024-07-07 DIAGNOSIS — Z853 Personal history of malignant neoplasm of breast: Secondary | ICD-10-CM | POA: Insufficient documentation

## 2024-07-07 LAB — CBC WITH DIFFERENTIAL (CANCER CENTER ONLY)
Abs Immature Granulocytes: 0.01 K/uL (ref 0.00–0.07)
Basophils Absolute: 0.1 K/uL (ref 0.0–0.1)
Basophils Relative: 2 %
Eosinophils Absolute: 0.2 K/uL (ref 0.0–0.5)
Eosinophils Relative: 4 %
HCT: 32.6 % — ABNORMAL LOW (ref 36.0–46.0)
Hemoglobin: 10.6 g/dL — ABNORMAL LOW (ref 12.0–15.0)
Immature Granulocytes: 0 %
Lymphocytes Relative: 48 %
Lymphs Abs: 2.5 K/uL (ref 0.7–4.0)
MCH: 31.9 pg (ref 26.0–34.0)
MCHC: 32.5 g/dL (ref 30.0–36.0)
MCV: 98.2 fL (ref 80.0–100.0)
Monocytes Absolute: 0.8 K/uL (ref 0.1–1.0)
Monocytes Relative: 15 %
Neutro Abs: 1.6 K/uL — ABNORMAL LOW (ref 1.7–7.7)
Neutrophils Relative %: 31 %
Platelet Count: 208 K/uL (ref 150–400)
RBC: 3.32 MIL/uL — ABNORMAL LOW (ref 3.87–5.11)
RDW: 14.3 % (ref 11.5–15.5)
WBC Count: 5.1 K/uL (ref 4.0–10.5)
nRBC: 0 % (ref 0.0–0.2)

## 2024-07-07 LAB — CMP (CANCER CENTER ONLY)
ALT: 24 U/L (ref 0–44)
AST: 32 U/L (ref 15–41)
Albumin: 3.7 g/dL (ref 3.5–5.0)
Alkaline Phosphatase: 97 U/L (ref 38–126)
Anion gap: 8 (ref 5–15)
BUN: 12 mg/dL (ref 8–23)
CO2: 29 mmol/L (ref 22–32)
Calcium: 9.3 mg/dL (ref 8.9–10.3)
Chloride: 105 mmol/L (ref 98–111)
Creatinine: 0.76 mg/dL (ref 0.44–1.00)
GFR, Estimated: >60 mL/min (ref >60)
Glucose, Bld: 97 mg/dL (ref 70–99)
Potassium: 4.3 mmol/L (ref 3.5–5.1)
Sodium: 141 mmol/L (ref 135–145)
Total Bilirubin: 0.3 mg/dL (ref 0.0–1.2)
Total Protein: 6.5 g/dL (ref 6.5–8.1)

## 2024-07-07 LAB — IRON AND TIBC
Iron: 52 ug/dL (ref 28–170)
Saturation Ratios: 14 % (ref 10.4–31.8)
TIBC: 370 ug/dL (ref 250–450)
UIBC: 317 ug/dL

## 2024-07-07 LAB — FERRITIN: Ferritin: 30 ng/mL (ref 11–307)

## 2024-07-07 MED ORDER — ALENDRONATE SODIUM 70 MG PO TABS: 70.0000 mg | ORAL_TABLET | ORAL | 5 refills | Status: AC

## 2024-07-07 NOTE — Telephone Encounter (Signed)
 Patient has been scheduled for follow-up visit per 07/07/24 LOS.  Pt given an appt calendar with date and time.

## 2024-07-07 NOTE — Progress Notes (Signed)
 [facility]  [address] [place],  [place]  [address] [phone]  Clinic Day: 07/07/24  Referring physician: Ofilia Lamar CROME, MD  ASSESSMENT & PLAN:  Assessment: 1. 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  She received adjuvant chemotherapy with CMF, followed by radiotherapy to the right breast, as well as letrozole 2.5 mg daily for 5 years.  She remains without evidence of disease.   2. Rheumatoid arthritis, for which she remains on golimumab.   3. Osteopenia, now osteoporosis.  She continues to take calcium and vitamin-D. Her bone density scan in November, 2023 showed worsening in the left hip and spine, now osteoporosis. We discussed alternatives for treating her bones with Prolia or Reclast, but she declined and will discuss with her PCP and Rheumatologist. She will be due for bone density scan in November, 2025.    4. Ductal carcinoma in situ of the left breast, discovered by calcifications in the upper outer quardant.  This was diagnosed in September of 2023, and treated with lumpectomy. She was offered chemoprevention of breast cancer with Raloxifene  but has decided not to take that.    5. History of mild chronic anemia, with recent worsening in September, 2024.  She denies any overt blood loss, but is on rivaroxaban. She remains on B12 oral supplementation, as well as multivitamin.  Ferritin done in November was low at 8 and other studies are also consistent with iron deficiency. She received  IV iron with 2 doses of Feraheme  and her hemoglobin came up to 13.3 on April, 9th in the ER and was 12.1 in February, now down to 11.0. She has seen Dr. Larene in September, 2025 and he performed colonoscopy and upper endoscopy and all was negative.   Plan: Her BP today is elevated at 194/77 with repeat being 182/80. She had a bilateral diagnostic mammogram done on 03/31/2024 that was clear. She had a bone density scan done on 06/30/2024 that revealed osteoporosis  of the spine with a T-score of -3.2 worse from -2.6, left femoral neck with a T-score of -2.5, left total hip with a T-score of -2.5 worse from -2.3, right femoral neck with a T-score of -2.5, and right total hip with a T-score of -2.2. These scores have been steadily worsening since 2020. I instructed her to begin oral vitamin D supplements immediately and we discussed the options of Fosamax , Reclast, and Prolia. I informed her of the potential side effects such as reflux and osteonecrosis of the jaw. I will prescribe Fosamax  70 mg once weekly and insisted she discuss these medications with her PCP and rheumatologist. Patient also completed a colonoscopy on 05/18/2024 which was clear and only showed small internal and external hemorrhoids. An EGD done the same day was negative. She has a WBC of 5.1, hemoglobin of 10.6 down from 11.0, and platelet count of 208,000. Patient continues golimumab 50 mg every 8 weeks and studies show that this can cause anemia. Her CMP is completely normal and her ferritin, iron, and TIBC levels are pending. I will call her with the results and I will see her back in 6 months with CBC and CMP. The patient understands the plans discussed today and is in agreement with them.  She knows to contact our office if she develops concerns prior to her next appointment.  I provided 30 minutes of face-to-face time during this this encounter and > 50% was spent counseling as documented under my assessment and plan.  [name],  MD  [facility] - A DEPT OF [facility] [address] [place] [address] Dept: [phone] Dept Fax: [phone]   No orders of the defined types were placed in this encounter.   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  She was treated with lumpectomy.  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  1 of 7 lymph nodes was positive for metastasis.  Estrogen and progesterone receptors were positive and her 2 Neu negative.  She received adjuvant chemotherapy with CMF, followed by radiotherapy to the right breast, as well as letrozole 2.5 mg daily for 5 years.  She has remained without evidence of recurrence.  Due to her personal and family history of cancer, we recommended she be tested for hereditary cancer syndromes.  The patient had the [employer] myRisk Hereditary Cancer Panel test last year and this was negative for any genetic mutations or variants of uncertain significance.  Annual bilateral mammogram had remained without evidence of malignancy.  She has had chronic anemia dating back to at least 2010.  Evaluation including nutritional studies, SPEP and haptoglobin did not reveal a specific etiology.  She does not know when her last colonoscopy was, but she states she was not advised to have another colonoscopy.  She was hospitalized in 2019 with a small-bowel obstruction.  This resolved with bowel rest. CT abdomen and pelvis in November 2019, at the time of her hospitalization, revealed dilated loops of small bowel and small volume ascites due to small-bowel obstruction, as well as mild biliary ductal dilatation, felt to be most likely secondary to previous cholecystectomy.  Review of her records revealed colonoscopy in October 2012 was negative except for small internal hemorrhoids.  She is seen by rheumatology and has been on Simponi Aria (golimumab) treatment for rheumatoid arthritis.  She has a history of atrial fibrillation and had been on apixaban .  She also has known osteopenia.  She is status post splenectomy and cholecystectomy.    She was diagnosed with hormone receptor negative  DCIS in October 2023.  She was treated with lumpectomy.  We offered her raloxifene  for chemoprevention in October 2023.  Bone density scan in November 2023 revealed osteoporosis a T-score of -2.6 in the spine and a T-score of -2.3 in the left femur.  She continues calcium and vitamin D daily.  The raloxifene  may help her bones as well, but she decided not to take it after reading the possible side effects.  I discussed treatment of the osteoporosis with Prolia or Reclast, but the patient decided against it.   Oncology History  Ductal carcinoma in situ (DCIS) of left breast  04/23/2022 Initial Diagnosis   Ductal carcinoma in situ (DCIS) of left breast   06/06/2022 Cancer Staging   Staging form: Breast, AJCC 8th Edition - Clinical stage from 06/06/2022: Stage 0 (cTis (DCIS), cN0, cM0, G3, ER-, PR-, HER2: Not Assessed) - Signed by Cornelius Wanda DEL, MD on 07/04/2022 Histopathologic type: Intraductal carcinoma, noninfiltrating, NOS Method of lymph node assessment: Clinical Nuclear grade: G3 Multigene prognostic tests performed: None Histologic grading system: 3 grade system Laterality: Left Tumor size (mm): 11 Lymph-vascular invasion (LVI): LVI not present (  absent)/not identified Diagnostic confirmation: Positive histology Specimen type: Excision Staged by: Managing physician Menopausal status: Postmenopausal Stage used in treatment planning: Yes National guidelines used in treatment planning: Yes Type of national guideline used in treatment planning: NCCN     INTERVAL HISTORY:  Micheline is here today for follow-up for her 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 She also had a remote history of a stage IIA right breast cancer in 2006. Patient states that she feels well but complains of occasional gait problems. She has no complaints of pain but her BP today is elevated at 194/77 with repeat being 182/80. She had a bilateral diagnostic mammogram done on 03/31/2024 that was clear. She had a  bone density scan done on 06/30/2024 that revealed osteoporosis of the spine with a T-score of -3.2 worse from -2.6, left femoral neck with a T-score of -2.5, left total hip with a T-score of -2.5 worse from -2.3, right femoral neck with a T-score of -2.5, and right total hip with a T-score of -2.2. These scores have been steadily worsening since 2020. I instructed her to begin oral vitamin D supplements immediately and we discussed the options of Fosamax , Reclast, and Prolia. I informed her of the potential side effects such as reflux and osteonecrosis of the jaw. I will prescribe Fosamax  70 mg once weekly and insisted she discuss these medications with her PCP and rheumatologist. Patient also completed a colonoscopy on 05/18/2024 which was clear and only showed small internal and external hemorrhoids. An EGD done the same day was negative. She has a WBC of 5.1, hemoglobin of 10.6 down from 11.0, and platelet count of 208,000. Patient continues golimumab 50 mg every 8 weeks and studies show that this can cause anemia. Her CMP is completely normal and her ferritin, iron, and TIBC levels are pending. I will call her back with the results and I will see her back in 6 months with CBC and CMP.  She denies fever, chills, night sweats, or other signs of infection. She denies cardiorespiratory and gastrointestinal issues. Her appetite is very good and Her weight has decreased 4 pounds over last 5.5 months. This patient is accompanied in the office by her husband.   REVIEW OF SYSTEMS:  Review of Systems  Constitutional: Negative.  Negative for appetite change, chills, diaphoresis, fatigue, fever and unexpected weight change.  HENT:  Negative.  Negative for hearing loss, lump/mass, mouth sores, nosebleeds, sore throat, tinnitus, trouble swallowing and voice change.   Eyes: Negative.  Negative for eye problems and icterus.  Respiratory: Negative.  Negative for chest tightness, cough, hemoptysis, shortness of breath and  wheezing.   Cardiovascular: Negative.  Negative for chest pain, leg swelling and palpitations.  Gastrointestinal: Negative.  Negative for abdominal distention, abdominal pain, blood in stool, constipation, diarrhea, nausea, rectal pain and vomiting.  Endocrine: Negative.  Negative for hot flashes.  Genitourinary: Negative.  Negative for bladder incontinence, difficulty urinating, dyspareunia, dysuria, frequency, hematuria, menstrual problem, nocturia, pelvic pain, vaginal bleeding and vaginal discharge.   Musculoskeletal:  Positive for arthralgias (rheumatoid arthritis) and gait problem (occasionally, due to knee pain). Negative for back pain, flank pain, myalgias, neck pain and neck stiffness.  Skin: Negative.  Negative for itching, rash and wound.  Neurological:  Positive for gait problem (occasionally, due to knee pain). Negative for dizziness, extremity weakness, headaches, light-headedness, numbness, seizures and speech difficulty.  Hematological: Negative.  Negative for adenopathy. Does not bruise/bleed easily.  Psychiatric/Behavioral: Negative.  Negative for confusion, decreased concentration, depression, sleep  disturbance and suicidal ideas. The patient is not nervous/anxious.    VITALS:  Blood pressure (!) 182/80, pulse (!) 54, temperature (!) 97.5 F (36.4 C), temperature source Oral, resp. rate 18, height 5' 2 (1.575 m), weight 157 lb 8 oz (71.4 kg), SpO2 100%.  Wt Readings from Last 3 Encounters:  07/07/24 157 lb 8 oz (71.4 kg)  01/13/24 153 lb 3.2 oz (69.5 kg)  10/14/23 159 lb 12.8 oz (72.5 kg)    Body mass index is 28.81 kg/m.  Performance status (ECOG): 1 - Symptomatic but completely ambulatory  PHYSICAL EXAM:  Physical Exam Vitals and nursing note reviewed. Exam conducted with a chaperone present.  Constitutional:      General: She is not in acute distress.    Appearance: Normal appearance. She is normal weight. She is not ill-appearing, toxic-appearing or diaphoretic.   HENT:     Head: Normocephalic and atraumatic.     Right Ear: Tympanic membrane, ear canal and external ear normal. There is no impacted cerumen.     Left Ear: Tympanic membrane, ear canal and external ear normal. There is no impacted cerumen.     Nose: Nose normal. No congestion or rhinorrhea.     Mouth/Throat:     Mouth: Mucous membranes are moist.     Pharynx: Oropharynx is clear. No oropharyngeal exudate or posterior oropharyngeal erythema.  Eyes:     General: No scleral icterus.       Right eye: No discharge.        Left eye: No discharge.     Extraocular Movements: Extraocular movements intact.     Conjunctiva/sclera: Conjunctivae normal.     Pupils: Pupils are equal, round, and reactive to light.  Neck:     Vascular: No carotid bruit.  Cardiovascular:     Rate and Rhythm: Regular rhythm. Bradycardia present.     Pulses: Normal pulses.     Heart sounds: Normal heart sounds. No murmur heard.    No friction rub. No gallop.  Pulmonary:     Effort: Pulmonary effort is normal. No respiratory distress.     Breath sounds: Normal breath sounds. No stridor. No wheezing, rhonchi or rales.  Chest:     Chest wall: No tenderness.  Breasts:    Right: Normal. No inverted nipple, mass, nipple discharge or skin change.     Left: Normal. No inverted nipple, mass, nipple discharge or skin change.     Comments: Scar in the central upper sternal area and the left upper chest Well healed scar in the lower outer quadrant of the right breast and right axilla Well healed scar in the lateral left breast No masses in either breasts Abdominal:     General: Bowel sounds are normal. There is no distension.     Palpations: Abdomen is soft. There is no hepatomegaly, splenomegaly or mass.     Tenderness: There is no abdominal tenderness. There is no right CVA tenderness, left CVA tenderness, guarding or rebound.     Hernia: No hernia is present.     Comments: Large midline abdominal scar which is well  healed   Musculoskeletal:        General: No swelling, tenderness or signs of injury. Normal range of motion.     Right hand: Deformity present.     Left hand: Deformity present.     Cervical back: Normal range of motion and neck supple. No rigidity or tenderness.     Right lower leg: Edema (trace) present.  Left lower leg: Edema (trace) present.     Comments: Rheumatoid deformities of both hands  Lymphadenopathy:     Cervical: No cervical adenopathy.     Right cervical: No superficial, deep or posterior cervical adenopathy.    Left cervical: No superficial, deep or posterior cervical adenopathy.     Upper Body:     Right upper body: No supraclavicular, axillary or pectoral adenopathy.     Left upper body: No supraclavicular, axillary or pectoral adenopathy.  Skin:    General: Skin is warm and dry.     Coloration: Skin is not jaundiced or pale.     Findings: No bruising, erythema, lesion or rash.  Neurological:     General: No focal deficit present.     Mental Status: She is alert and oriented to person, place, and time. Mental status is at baseline.     Cranial Nerves: No cranial nerve deficit.     Sensory: No sensory deficit.     Motor: No weakness.     Coordination: Coordination normal.     Gait: Gait normal.     Deep Tendon Reflexes: Reflexes normal.  Psychiatric:        Mood and Affect: Mood normal.        Behavior: Behavior normal.        Thought Content: Thought content normal.        Judgment: Judgment normal.    LABS:      Latest Ref Rng & Units 07/07/2024    9:16 AM 01/13/2024    8:40 AM 10/14/2023    8:21 AM  CBC  WBC 4.0 - 10.5 K/uL 5.1  4.4  4.9   Hemoglobin 12.0 - 15.0 g/dL 89.3  88.9  87.8   Hematocrit 36.0 - 46.0 % 32.6  33.6  36.1   Platelets 150 - 400 K/uL 208  206  181       Latest Ref Rng & Units 07/07/2024    9:16 AM 01/13/2024    8:40 AM 10/14/2023    8:21 AM  CMP  Glucose 70 - 99 mg/dL 97  99  898   BUN 8 - 23 mg/dL 12  20  13     Creatinine 0.44 - 1.00 mg/dL 9.23  9.14  9.14   Sodium 135 - 145 mmol/L 141  142  142   Potassium 3.5 - 5.1 mmol/L 4.3  4.4  4.2   Chloride 98 - 111 mmol/L 105  105  107   CO2 22 - 32 mmol/L 29  26  25    Calcium 8.9 - 10.3 mg/dL 9.3  9.8  9.3   Total Protein 6.5 - 8.1 g/dL 6.5  6.4  6.7   Total Bilirubin 0.0 - 1.2 mg/dL 0.3  0.6  0.6   Alkaline Phos 38 - 126 U/L 97  91  95   AST 15 - 41 U/L 32  30  24   ALT 0 - 44 U/L 24  26  17     No results found for: CEA1, CEA / No results found for: CEA1, CEA No results found for: PSA1 No results found for: CAN199 No results found for: CAN125  No results found for: STEPHANY RINGS, A1GS, A2GS, EARLA BABCOCK, GAMS, MSPIKE, SPEI Lab Results  Component Value Date   TIBC 370 07/07/2024   TIBC 290 01/13/2024   TIBC 290 10/14/2023   FERRITIN 30 07/07/2024   FERRITIN 150 01/13/2024   FERRITIN 153 10/14/2023   IRONPCTSAT 14 07/07/2024  IRONPCTSAT 40 (H) 01/13/2024   IRONPCTSAT 39 (H) 10/14/2023  No results found for: TSH, T3TOTAL, T4TOTAL, THYROIDAB Lab Results  Component Value Date   FERRITIN 30 07/07/2024   No results found for: LDH  STUDIES:  Exam: 05/18/2024 Colonoscopy & EGD Impression: Small internal and external hemorrhoids. EGD- negative  Exam: 03/31/2024 Bilateral Diagnostic Mammogram Impression: No mammographic evidence of malignancy.   HISTORY:   Past Medical History:  Diagnosis Date   Anemia    Arthritis    Rheumatoid and osteoarthritis   Cancer (HCC)    skin,breast  right   Depression    Dysrhythmia    Afib   Hypertension    Hypothyroidism    Pre-diabetes    denies states she is no longer pre DM    Past Surgical History:  Procedure Laterality Date   BREAST SURGERY     CANCER   CHOLECYSTECTOMY     GASTRIC RESTRICTION SURGERY     HERNIA REPAIR     JOINT REPLACEMENT Right    KNEE   REVERSE SHOULDER ARTHROPLASTY Left 12/21/2020   Procedure: REVERSE  SHOULDER ARTHROPLASTY;  Surgeon: Melita Drivers, MD;  Location: [facility];  Service: Orthopedics;  Laterality: Left;    TONSILLECTOMY     TOTAL SHOULDER REVISION Left 06/12/2021   Procedure: Revision left reverse shoulder arthroplasty;  Surgeon: Melita Drivers, MD;  Location: [facility];  Service: Orthopedics;  Laterality: Left;    Family History  Problem Relation Age of Onset   Arthritis Mother    Hypertension Mother    Cancer Father        CANCER    Social History:  reports that she has never smoked. She has never used smokeless tobacco. She reports that she does not drink alcohol and does not use drugs.The patient is accompanied by her husband today.  Allergies:  Allergies  Allergen Reactions   Atorvastatin Other (See Comments)    At 80 mg (?)  atorvastatin   Atorvastatin Calcium Other (See Comments)    atorvastatin calcium   Infliximab Dermatitis and Rash    remicade  infliximab    Current Medications: Current Outpatient Medications  Medication Sig Dispense Refill   alendronate  (FOSAMAX ) 70 MG tablet Take 1 tablet (70 mg total) by mouth once a week. Take with a full glass of water  on an empty stomach. 4 tablet 5   amiodarone (PACERONE) 200 MG tablet Take 200 mg by mouth daily.     ammonium lactate (LAC-HYDRIN) 12 % lotion Apply 1 Application topically as needed.     atorvastatin (LIPITOR) 40 MG tablet Take 40 mg by mouth daily.     Calcium Carb-Cholecalciferol (CALCIUM 600 + D PO) Take 1 tablet by mouth daily.     cholecalciferol (VITAMIN D3) 25 MCG (1000 UNIT) tablet Take 1,000 Units by mouth daily.     clobetasol (TEMOVATE) 0.05 % external solution Apply 1 Application topically 2 (two) times daily.     docusate sodium  (COLACE) 100 MG capsule Take 100 mg by mouth 2 (two) times daily.     furosemide (LASIX) 20 MG tablet Take 20 mg by mouth daily as needed for edema.     golimumab (SIMPONI ARIA) 50 MG/4ML SOLN injection Inject 50 mg into the vein every 8 (eight) weeks.      leflunomide  (ARAVA ) 20 MG tablet Take 20 mg by mouth daily.     levothyroxine  (SYNTHROID ) 25 MCG tablet Take 25 mcg by mouth daily before breakfast.     losartan  (  COZAAR ) 25 MG tablet Take 25 mg by mouth daily.     metoprolol  succinate (TOPROL -XL) 25 MG 24 hr tablet Take 12.5 mg by mouth daily.     Multiple Vitamin (MULTIVITAMIN ADULT) TABS Take 1 tablet by mouth daily.     PARoxetine  (PAXIL ) 30 MG tablet Take 30 mg by mouth every morning.     predniSONE  (DELTASONE ) 20 MG tablet Take 20 mg, #2 pills (40 mg dose) daily x 3 days, then take 20 mg, #1 pill (20 mg dose) daily x 3 days. 9 tablet 0   promethazine -dextromethorphan (PROMETHAZINE -DM) 6.25-15 MG/5ML syrup Take 5 mLs by mouth 4 (four) times daily as needed for cough. Do not use and drive - May make drowsy. 118 mL 0   rivaroxaban (XARELTO) 20 MG TABS tablet Take by mouth.     vitamin B-12 (CYANOCOBALAMIN ) 1000 MCG tablet Take 1,000 mcg by mouth daily.     No current facility-administered medications for this visit.    I,Jasmine M Lassiter,acting as a scribe for [name], MD.,have documented all relevant documentation on the behalf of [name], MD,as directed by  [name], MD while in the presence of [name], MD.

## 2024-07-10 ENCOUNTER — Ambulatory Visit (HOSPITAL_BASED_OUTPATIENT_CLINIC_OR_DEPARTMENT_OTHER)
Admission: EM | Admit: 2024-07-10 | Discharge: 2024-07-10 | Disposition: A | Discharge status: Home / Self Care | Attending: Family Medicine | Admitting: Family Medicine

## 2024-07-10 ENCOUNTER — Encounter (HOSPITAL_BASED_OUTPATIENT_CLINIC_OR_DEPARTMENT_OTHER): Payer: Self-pay | Admitting: Family Medicine

## 2024-07-10 ENCOUNTER — Ambulatory Visit (HOSPITAL_BASED_OUTPATIENT_CLINIC_OR_DEPARTMENT_OTHER): Admit: 2024-07-10 | Discharge: 2024-07-10 | Disposition: A | Admitting: Radiology

## 2024-07-10 ENCOUNTER — Ambulatory Visit (HOSPITAL_BASED_OUTPATIENT_CLINIC_OR_DEPARTMENT_OTHER): Payer: Self-pay | Admitting: Family Medicine

## 2024-07-10 DIAGNOSIS — R0602 Shortness of breath: Secondary | ICD-10-CM

## 2024-07-10 DIAGNOSIS — R053 Chronic cough: Secondary | ICD-10-CM | POA: Diagnosis not present

## 2024-07-10 DIAGNOSIS — J208 Acute bronchitis due to other specified organisms: Secondary | ICD-10-CM

## 2024-07-10 MED ORDER — PROMETHAZINE-DM 6.25-15 MG/5ML PO SYRP: 5.0000 mL | ORAL_SOLUTION | Freq: Four times a day (QID) | ORAL | 0 refills | Status: AC | PRN

## 2024-07-10 MED ORDER — AEROCHAMBER PLUS FLO-VU MEDIUM MISC
1.0000 | Freq: Once | Status: AC
  Administered 2024-07-10: 1

## 2024-07-10 MED ORDER — PREDNISONE 20 MG PO TABS: ORAL_TABLET | ORAL | 0 refills | Status: AC

## 2024-07-10 MED ORDER — ALBUTEROL SULFATE HFA 108 (90 BASE) MCG/ACT IN AERS
2.0000 | INHALATION_SPRAY | Freq: Once | RESPIRATORY_TRACT | Status: AC
Start: 2024-07-10 — End: 2024-07-10
  Administered 2024-07-10: 2 via RESPIRATORY_TRACT

## 2024-07-10 NOTE — Discharge Instructions (Addendum)
 Acute viral bronchitis with cough and shortness of breath: Chest x-ray is negative.  Chest x-ray does show some cardiomegaly or enlargement of the heart.  This is not acute this is chronic and old.  Needs prednisone 20 mg, #2 pills (40 mg dose) daily x 3 days, then take 20 mg, #1 pill (20 mg dose) daily x 3 days.  Patient has rheumatoid arthritis and is taken prednisone many times.  She is comfortable with possible side effects of prednisone.  Promethazine DM, 5 mL, every 6 hours if needed for cough.  Patient cautioned that the cough syrup could make her sleepy.  Albuterol inhaler with spacer, 2 puffs, every 4 hours if needed for wheezing.  Provided handout on how to use the albuterol inhaler.  Provided the inhaler during the visit and nursing provided instruction and demonstration of use.  The patient was able to use the inhaler while she was here so that she could know how to do it.  If symptoms do not improve, if symptoms worsen or if new symptoms occur, patient needs to see primary care or return here for further evaluation.  I did advise the patient that once she uses the prednisone, if she has blood work (a CBC with differential) her white blood cell count could be elevated just due to prednisone use and not necessarily due to infection.

## 2024-07-10 NOTE — ED Triage Notes (Signed)
Triaged by Provider. 

## 2024-07-10 NOTE — ED Provider Notes (Addendum)
 [facility]    CSN: [id number] Arrival date & time: 07/10/24  0805      History   Chief Complaint No chief complaint on file.   HPI Deborah Frazier is a 78 y.o. female.   78 year old female here with her husband.  Patient reports persistent cough since approximately mid September 2025.  She has chronic body aches because she has rheumatoid arthritis so that is not acute or new.  She is having runny nose, nasal congestion.  The cough is sometimes productive with the slightly yellow sputum.  If she coughs a lot she gets into spasms and then she wheezes and is short of breath.  It is gone on so long she is concerned about why she has a persistent cough.  She is not aware of any fever.  She denies nausea, vomiting, constipation, diarrhea.     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   Edema leg 05/11/2012   Personal history of malignant neoplasm of breast 08/16/2005    Past Surgical History:  Procedure Laterality Date   BREAST SURGERY     CANCER   CHOLECYSTECTOMY     GASTRIC RESTRICTION SURGERY     HERNIA REPAIR     JOINT REPLACEMENT Right    KNEE   REVERSE SHOULDER ARTHROPLASTY Left 12/21/2020   Procedure: REVERSE SHOULDER ARTHROPLASTY;  Surgeon: Melita Drivers, MD;  Location: [facility];  Service: Orthopedics;   Laterality: Left;    TONSILLECTOMY     TOTAL SHOULDER REVISION Left 06/12/2021   Procedure: Revision left reverse shoulder arthroplasty;  Surgeon: Melita Drivers, MD;  Location: [facility];  Service: Orthopedics;  Laterality: Left;    OB History   No obstetric history on file.      Home Medications    Prior to Admission medications   Medication Sig Start Date End Date Taking? Authorizing Provider  predniSONE (DELTASONE) 20 MG tablet Take 20 mg, #2 pills (40 mg dose) daily x 3 days, then take 20 mg, #1 pill (20 mg dose) daily x 3 days. 07/10/24  Yes Ival Domino, FNP  promethazine-dextromethorphan (PROMETHAZINE-DM) 6.25-15 MG/5ML syrup Take 5 mLs by mouth 4 (four) times daily as needed for cough. Do not use and drive - May make drowsy. 07/10/24  Yes Ival Domino, FNP  alendronate (FOSAMAX) 70 MG tablet Take 1 tablet (70 mg total) by mouth once a week. Take with a full glass of water  on an empty stomach. 07/07/24   Cornelius Wanda DEL, MD  amiodarone (PACERONE) 200 MG tablet Take 200 mg by mouth daily.    [provider]  ammonium lactate (LAC-HYDRIN) 12 % lotion Apply 1 Application topically as needed. 03/03/23   [provider]  atorvastatin (LIPITOR) 40 MG tablet Take 40 mg by mouth daily. 02/25/23   [provider]  Calcium Carb-Cholecalciferol (CALCIUM 600 + D PO) Take 1 tablet by mouth daily.    [provider]  cholecalciferol (VITAMIN D3) 25 MCG (1000 UNIT) tablet Take 1,000 Units by mouth daily.    [provider]  clobetasol (TEMOVATE) 0.05 % external solution Apply 1 Application topically 2 (two) times daily.    [provider]  docusate sodium  (COLACE) 100 MG capsule Take 100 mg by mouth 2 (two) times daily.    [provider]  furosemide (LASIX) 20 MG tablet Take 20 mg by mouth daily as needed for edema. 05/17/16   [provider]  golimumab (SIMPONI ARIA) 50 MG/4ML SOLN injection Inject 50 mg into the vein  every 8 (eight) weeks.    [provider]  leflunomide  (ARAVA ) 20 MG tablet Take 20 mg by mouth daily.    [provider]  levothyroxine  (SYNTHROID ) 25 MCG tablet Take 25 mcg by mouth daily before breakfast.    [provider]  losartan  (COZAAR ) 25 MG tablet Take 25 mg by mouth daily. 12/15/23   [provider]  metoprolol  succinate (TOPROL -XL) 25 MG 24 hr tablet Take 12.5 mg by mouth daily.    [provider]  Multiple Vitamin (MULTIVITAMIN ADULT) TABS Take 1 tablet by mouth daily.    [provider]  PARoxetine  (PAXIL ) 30 MG tablet Take 30 mg by mouth every morning.    [provider]  rivaroxaban (XARELTO) 20 MG TABS tablet Take by mouth. 10/02/21   [provider]  vitamin B-12 (CYANOCOBALAMIN ) 1000 MCG tablet Take 1,000 mcg by mouth daily.    [provider]    Family History Family History  Problem Relation Age of Onset   Arthritis Mother    Hypertension Mother    Cancer Father        CANCER    Social History Social History   Tobacco Use   Smoking status: Never   Smokeless tobacco: Never  Vaping Use   Vaping status: Never Used  Substance Use Topics   Alcohol use: No   Drug use: No     Allergies   Atorvastatin, Atorvastatin calcium, and Infliximab   Review of Systems Review of Systems  Constitutional:  Negative for chills and fever.  HENT:  Positive for congestion, postnasal drip and rhinorrhea. Negative for ear pain and sore throat.   Eyes:  Negative for pain and visual disturbance.  Respiratory:  Positive for cough, shortness of breath and wheezing.   Cardiovascular:  Negative for chest pain and palpitations.  Gastrointestinal:  Negative for abdominal pain, constipation, diarrhea, nausea and vomiting.  Genitourinary:  Negative for dysuria and hematuria.  Musculoskeletal:  Positive for arthralgias. Negative for back pain.  Skin:  Negative for color change and rash.  Neurological:   Negative for seizures and syncope.  All other systems reviewed and are negative.    Physical Exam Triage Vital Signs ED Triage Vitals  Encounter Vitals Group     BP      Girls Systolic BP Percentile      Girls Diastolic BP Percentile      Boys Systolic BP Percentile      Boys Diastolic BP Percentile      Pulse      Resp      Temp      Temp src      SpO2  Weight      Height      Head Circumference      Peak Flow      Pain Score      Pain Loc      Pain Education      Exclude from Growth Chart    No data found.  Updated Vital Signs BP (!) 150/91 (BP Location: Right Arm)   Pulse (!) 54   Temp (!) 97.3 F (36.3 C) (Oral)   Resp 18   SpO2 95%   Visual Acuity Right Eye Distance:   Left Eye Distance:   Bilateral Distance:    Right Eye Near:   Left Eye Near:    Bilateral Near:     Physical Exam Vitals and nursing note reviewed.  Constitutional:      General: She is not in acute distress.    Appearance: She is well-developed. She is not ill-appearing, toxic-appearing or diaphoretic.  HENT:     Head: Normocephalic and atraumatic.     Right Ear: Hearing, tympanic membrane, ear canal and external ear normal.     Left Ear: Hearing, tympanic membrane, ear canal and external ear normal.     Nose: Congestion and rhinorrhea present. Rhinorrhea is clear.     Right Sinus: No maxillary sinus tenderness or frontal sinus tenderness.     Left Sinus: No maxillary sinus tenderness or frontal sinus tenderness.     Mouth/Throat:     Lips: Pink.     Mouth: Mucous membranes are moist.     Pharynx: Uvula midline. Postnasal drip present. No oropharyngeal exudate or posterior oropharyngeal erythema.     Tonsils: No tonsillar exudate.  Eyes:     Conjunctiva/sclera: Conjunctivae normal.     Pupils: Pupils are equal, round, and reactive to light.  Cardiovascular:     Rate and Rhythm: Normal rate and regular rhythm.     Heart sounds: S1 normal and S2 normal. No murmur  heard. Pulmonary:     Effort: Pulmonary effort is normal. No respiratory distress.     Breath sounds: Examination of the right-middle field reveals decreased breath sounds. Examination of the right-lower field reveals decreased breath sounds and rhonchi. Examination of the left-lower field reveals decreased breath sounds and rhonchi. Decreased breath sounds and rhonchi present. No wheezing or rales.  Abdominal:     General: Bowel sounds are normal.     Palpations: Abdomen is soft.     Tenderness: There is no abdominal tenderness.  Musculoskeletal:        General: No swelling.     Cervical back: Neck supple.  Lymphadenopathy:     Head:     Right side of head: No submental, submandibular, tonsillar, preauricular or posterior auricular adenopathy.     Left side of head: No submental, submandibular, tonsillar, preauricular or posterior auricular adenopathy.     Cervical: No cervical adenopathy.     Right cervical: No superficial cervical adenopathy.    Left cervical: No superficial cervical adenopathy.  Skin:    General: Skin is warm and dry.     Capillary Refill: Capillary refill takes less than 2 seconds.     Findings: No rash.  Neurological:     Mental Status: She is alert and oriented to person, place, and time.     Gait: Gait abnormal (Patient does not use an assistive device but she is unsteady on her feet.).  Psychiatric:        Mood and Affect: Mood normal.  UC Treatments / Results  Labs (all labs ordered are listed, but only abnormal results are displayed) Labs Reviewed - No data to display  EKG   Radiology DG Chest 2 View Result Date: 07/10/2024 CLINICAL DATA:  Chronic cough EXAM: CHEST - 2 VIEW COMPARISON:  Chest x-ray performed April 27, 2023 FINDINGS: Heart is enlarged. No focal infiltrate, pleural effusion, or pneumothorax. Surgical clips project over the right breast. Left shoulder prosthesis. Degenerative changes in the thoracic spine. Chronic right-sided  rib deformities. IMPRESSION: 1. Similar appearance of mild cardiomegaly.  No overt edema. Electronically Signed   By: Maude Naegeli M.D.   On: 07/10/2024 09:05    Procedures Procedures (including critical care time)  Medications Ordered in UC Medications  albuterol (VENTOLIN HFA) 108 (90 Base) MCG/ACT inhaler 2 puff (2 puffs Inhalation Given 07/10/24 0919)  AeroChamber Plus Flo-Vu Medium MISC 1 each (1 each Other Given 07/10/24 0919)    Initial Impression / Assessment and Plan / UC Course  I have reviewed the triage vital signs and the nursing notes.  Pertinent labs & imaging results that were available during my care of the patient were reviewed by me and considered in my medical decision making (see chart for details).  Plan of Care: Acute viral bronchitis with cough and shortness of breath: Chest x-ray is negative.  Chest x-ray does show some cardiomegaly or enlargement of the heart.  This is not acute this is chronic and old.  Needs prednisone 20 mg, #2 pills (40 mg dose) daily x 3 days, then take 20 mg, #1 pill (20 mg dose) daily x 3 days.  Patient has rheumatoid arthritis and is taken prednisone many times.  She is comfortable with possible side effects of prednisone.  Promethazine DM, 5 mL, every 6 hours if needed for cough.  Patient cautioned that the cough syrup could make her sleepy.  Albuterol inhaler with spacer, 2 puffs, every 4 hours if needed for wheezing.  Provided handout on how to use the albuterol inhaler.  Provided the inhaler during the visit and nursing provided instruction and demonstration of use.  The patient was able to use the inhaler while she was here so that she could know how to do it.  If symptoms do not improve, if symptoms worsen or if new symptoms occur, patient needs to see primary care or return here for further evaluation.  I did advise the patient that once she uses the prednisone, if she has blood work (a CBC with differential) her white blood cell count  could be elevated just due to prednisone use and not necessarily due to infection.   I reviewed the plan of care with the patient and/or the patient's guardian.  The patient and/or guardian had time to ask questions and acknowledged that the questions were answered.  I provided instruction on symptoms or reasons to return here or to go to an ER, if symptoms/condition did not improve, worsened or if new symptoms occurred.  Final Clinical Impressions(s) / UC Diagnoses   Final diagnoses:  Chronic cough  Shortness of breath  Acute viral bronchitis     Discharge Instructions      Acute viral bronchitis with cough and shortness of breath: Chest x-ray is negative.  Chest x-ray does show some cardiomegaly or enlargement of the heart.  This is not acute this is chronic and old.  Needs prednisone 20 mg, #2 pills (40 mg dose) daily x 3 days, then take 20 mg, #1 pill (20 mg dose)  daily x 3 days.  Patient has rheumatoid arthritis and is taken prednisone many times.  She is comfortable with possible side effects of prednisone.  Promethazine DM, 5 mL, every 6 hours if needed for cough.  Patient cautioned that the cough syrup could make her sleepy.  Albuterol inhaler with spacer, 2 puffs, every 4 hours if needed for wheezing.  Provided handout on how to use the albuterol inhaler.  Provided the inhaler during the visit and nursing provided instruction and demonstration of use.  The patient was able to use the inhaler while she was here so that she could know how to do it.  If symptoms do not improve, if symptoms worsen or if new symptoms occur, patient needs to see primary care or return here for further evaluation.  I did advise the patient that once she uses the prednisone, if she has blood work (a CBC with differential) her white blood cell count could be elevated just due to prednisone use and not necessarily due to infection.      ED Prescriptions     Medication Sig Dispense Auth. Provider    predniSONE (DELTASONE) 20 MG tablet Take 20 mg, #2 pills (40 mg dose) daily x 3 days, then take 20 mg, #1 pill (20 mg dose) daily x 3 days. 9 tablet Shanece Cochrane, FNP   promethazine-dextromethorphan (PROMETHAZINE-DM) 6.25-15 MG/5ML syrup Take 5 mLs by mouth 4 (four) times daily as needed for cough. Do not use and drive - May make drowsy. 118 mL Ival Domino, FNP      PDMP not reviewed this encounter.   Ival Domino, FNP 07/10/24 [id number]    Ival Domino, FNP 07/10/24 [phone]

## 2024-07-10 NOTE — Progress Notes (Signed)
 Chest x-ray shows cardiomegaly which is an old finding.  Otherwise the chest x-ray is clear.  Patient updated.

## 2024-07-20 ENCOUNTER — Encounter: Payer: Self-pay | Admitting: Hematology and Oncology

## 2024-08-10 LAB — HM COLONOSCOPY

## 2024-09-06 ENCOUNTER — Ambulatory Visit (HOSPITAL_BASED_OUTPATIENT_CLINIC_OR_DEPARTMENT_OTHER)
Admission: EM | Admit: 2024-09-06 | Discharge: 2024-09-06 | Disposition: A | Discharge status: Home / Self Care | Attending: Family Medicine | Admitting: Family Medicine

## 2024-09-06 ENCOUNTER — Encounter (HOSPITAL_BASED_OUTPATIENT_CLINIC_OR_DEPARTMENT_OTHER): Payer: Self-pay

## 2024-09-06 DIAGNOSIS — Z20822 Contact with and (suspected) exposure to covid-19: Secondary | ICD-10-CM

## 2024-09-06 LAB — POC SOFIA SARS ANTIGEN FIA: SARS Coronavirus 2 Ag: NEGATIVE

## 2024-09-06 NOTE — ED Provider Notes (Signed)
 " [facility]    CSN: [id number] Arrival date & time: 09/06/24  1247      History   Chief Complaint No chief complaint on file.   HPI Deborah Frazier is a 79 y.o. female.   Pt presents to the office exposure to covid. Husband tested positive this morning. No active symptoms at this time.       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   Edema leg 05/11/2012   Personal history of malignant neoplasm of breast 08/16/2005    Past Surgical History:  Procedure Laterality Date   BREAST SURGERY     CANCER   CHOLECYSTECTOMY     GASTRIC RESTRICTION SURGERY     HERNIA REPAIR     JOINT REPLACEMENT Right    KNEE   REVERSE SHOULDER ARTHROPLASTY Left 12/21/2020   Procedure: REVERSE SHOULDER ARTHROPLASTY;  Surgeon: Melita Drivers, MD;  Location: [facility];  Service: Orthopedics;  Laterality: Left;    TONSILLECTOMY     TOTAL SHOULDER REVISION Left 06/12/2021   Procedure: Revision left reverse shoulder arthroplasty;  Surgeon: Melita Drivers, MD;  Location: [facility];  Service: Orthopedics;  Laterality: Left;    OB History   No obstetric history on file.      Home Medications    Prior to Admission medications  Medication Sig Start Date End Date Taking? Authorizing Provider  alendronate  (FOSAMAX ) 70 MG tablet Take 1  tablet (70 mg total) by mouth once a week. Take with a full glass of water  on an empty stomach. 07/07/24   Cornelius Wanda DEL, MD  amiodarone (PACERONE) 200 MG tablet Take 200 mg by mouth daily.    [provider]  ammonium lactate (LAC-HYDRIN) 12 % lotion Apply 1 Application topically as needed. 03/03/23   [provider]  atorvastatin (LIPITOR) 40 MG tablet Take 40 mg by mouth daily. 02/25/23   [provider]  Calcium Carb-Cholecalciferol (CALCIUM 600 + D PO) Take 1 tablet by mouth daily.    [provider]  cholecalciferol (VITAMIN D3) 25 MCG (1000 UNIT) tablet Take 1,000 Units by mouth daily.    [provider]  clobetasol (TEMOVATE) 0.05 % external solution Apply 1 Application topically 2 (two) times daily.    [provider]  docusate sodium  (COLACE) 100 MG capsule Take 100 mg by mouth 2 (two) times daily.    [provider]  furosemide (LASIX) 20 MG tablet Take 20 mg by mouth daily as needed for edema. 05/17/16   [provider]  golimumab (SIMPONI ARIA) 50 MG/4ML SOLN injection Inject 50 mg into the vein every 8 (eight) weeks.    [provider]  leflunomide  (ARAVA ) 20 MG tablet Take  20 mg by mouth daily.    [provider]  levothyroxine  (SYNTHROID ) 25 MCG tablet Take 25 mcg by mouth daily before breakfast.    [provider]  losartan  (COZAAR ) 25 MG tablet Take 25 mg by mouth daily. 12/15/23   [provider]  metoprolol  succinate (TOPROL -XL) 25 MG 24 hr tablet Take 12.5 mg by mouth daily.    [provider]  Multiple Vitamin (MULTIVITAMIN ADULT) TABS Take 1 tablet by mouth daily.    [provider]  PARoxetine  (PAXIL ) 30 MG tablet Take 30 mg by mouth every morning.    [provider]  predniSONE  (DELTASONE ) 20 MG tablet Take 20 mg, #2 pills (40 mg dose) daily x 3 days, then take 20 mg, #1 pill (20 mg dose) daily x 3 days. 07/10/24   Ival Domino, FNP   promethazine -dextromethorphan (PROMETHAZINE -DM) 6.25-15 MG/5ML syrup Take 5 mLs by mouth 4 (four) times daily as needed for cough. Do not use and drive - May make drowsy. 07/10/24   Ival Domino, FNP  rivaroxaban (XARELTO) 20 MG TABS tablet Take by mouth. 10/02/21   [provider]  vitamin B-12 (CYANOCOBALAMIN ) 1000 MCG tablet Take 1,000 mcg by mouth daily.    [provider]    Family History Family History  Problem Relation Age of Onset   Arthritis Mother    Hypertension Mother    Cancer Father        CANCER    Social History Social History[1]   Allergies   Atorvastatin, Atorvastatin calcium, and Infliximab   Review of Systems Review of Systems   Physical Exam Triage Vital Signs ED Triage Vitals [09/06/24 1317]  Encounter Vitals Group     BP (!) 158/77     Girls Systolic BP Percentile      Girls Diastolic BP Percentile      Boys Systolic BP Percentile      Boys Diastolic BP Percentile      Pulse Rate (!) 101     Resp 16     Temp 98 F (36.7 C)     Temp Source Oral     SpO2 95 %     Weight      Height      Head Circumference      Peak Flow      Pain Score      Pain Loc      Pain Education      Exclude from Growth Chart    No data found.  Updated Vital Signs BP (!) 158/77 (BP Location: Left Arm)   Pulse (!) 101   Temp 98 F (36.7 C) (Oral)   Resp 16   SpO2 95%   Visual Acuity Right Eye Distance:   Left Eye Distance:   Bilateral Distance:    Right Eye Near:   Left Eye Near:    Bilateral Near:     Physical Exam   UC Treatments / Results  Labs (all labs ordered are listed, but only abnormal results are displayed) Labs Reviewed  POC SOFIA SARS ANTIGEN FIA    EKG   Radiology No results found.  Procedures Procedures (including critical care time)  Medications Ordered in UC Medications - No data to display  Initial Impression / Assessment and Plan / UC Course  I have reviewed the triage vital signs and the  nursing notes.  Pertinent labs & imaging results that were available during my care of the patient were reviewed by me and considered  in my medical decision making (see chart for details).     *** Final Clinical Impressions(s) / UC Diagnoses   Final diagnoses:  None   Discharge Instructions   None    ED Prescriptions   None    PDMP not reviewed this encounter.    [1]  Social History Tobacco Use   Smoking status: Never   Smokeless tobacco: Never  Vaping Use   Vaping status: Never Used  Substance Use Topics   Alcohol use: No   Drug use: No   "

## 2024-09-06 NOTE — Discharge Instructions (Signed)
 Covid test negative. If you start developing symptoms there is no need to re test. You can take OTC medications as needed.

## 2024-09-06 NOTE — ED Triage Notes (Signed)
 Pt presents to the office exposure to covid. Husband tested positive this morning. No active symptoms at this time.

## 2025-01-05 ENCOUNTER — Inpatient Hospital Stay: Admitting: Oncology

## 2025-01-05 ENCOUNTER — Inpatient Hospital Stay
